# Patient Record
Sex: Male | Born: 1950
Health system: Southern US, Community
[De-identification: ages and names within clinical notes are randomized; demographics above are authoritative.]

## PROBLEM LIST (undated history)

## (undated) DIAGNOSIS — J449 Chronic obstructive pulmonary disease, unspecified: Secondary | ICD-10-CM

## (undated) DIAGNOSIS — Z91199 Patient's noncompliance with other medical treatment and regimen due to unspecified reason: Secondary | ICD-10-CM

## (undated) DIAGNOSIS — F1490 Cocaine use, unspecified, uncomplicated: Secondary | ICD-10-CM

## (undated) DIAGNOSIS — I10 Essential (primary) hypertension: Secondary | ICD-10-CM

## (undated) DIAGNOSIS — F17200 Nicotine dependence, unspecified, uncomplicated: Secondary | ICD-10-CM

## (undated) DIAGNOSIS — I2510 Atherosclerotic heart disease of native coronary artery without angina pectoris: Secondary | ICD-10-CM

## (undated) DIAGNOSIS — N182 Chronic kidney disease, stage 2 (mild): Secondary | ICD-10-CM

## (undated) DIAGNOSIS — Z9119 Patient's noncompliance with other medical treatment and regimen: Secondary | ICD-10-CM

## (undated) DIAGNOSIS — T148XXA Other injury of unspecified body region, initial encounter: Secondary | ICD-10-CM

## (undated) DIAGNOSIS — B1920 Unspecified viral hepatitis C without hepatic coma: Secondary | ICD-10-CM

## (undated) DIAGNOSIS — I428 Other cardiomyopathies: Secondary | ICD-10-CM

## (undated) DIAGNOSIS — I5022 Chronic systolic (congestive) heart failure: Secondary | ICD-10-CM

## (undated) HISTORY — DX: Patient's noncompliance with other medical treatment and regimen: Z91.19

## (undated) HISTORY — DX: Patient's noncompliance with other medical treatment and regimen due to unspecified reason: Z91.199

## (undated) HISTORY — PX: TONSILLECTOMY: SUR1361

## (undated) HISTORY — DX: Chronic systolic (congestive) heart failure: I50.22

## (undated) HISTORY — DX: Chronic kidney disease, stage 2 (mild): N18.2

## (undated) HISTORY — DX: Unspecified viral hepatitis C without hepatic coma: B19.20

## (undated) HISTORY — DX: Cocaine use, unspecified, uncomplicated: F14.90

## (undated) HISTORY — PX: FRACTURE SURGERY: SHX138

---

## 1974-09-23 DIAGNOSIS — T148XXA Other injury of unspecified body region, initial encounter: Secondary | ICD-10-CM

## 1974-09-23 HISTORY — DX: Other injury of unspecified body region, initial encounter: T14.8XXA

## 2008-05-02 ENCOUNTER — Emergency Department (HOSPITAL_COMMUNITY): Admission: EM | Admit: 2008-05-02 | Discharge: 2008-05-02 | Payer: Self-pay | Admitting: Emergency Medicine

## 2014-07-26 ENCOUNTER — Encounter (HOSPITAL_COMMUNITY): Payer: Self-pay | Admitting: *Deleted

## 2014-07-26 ENCOUNTER — Inpatient Hospital Stay (HOSPITAL_COMMUNITY)
Admission: EM | Admit: 2014-07-26 | Discharge: 2014-07-29 | DRG: 280 | Disposition: A | Discharge status: Home / Self Care | Payer: Medicare Other | Attending: Cardiology | Admitting: Cardiology

## 2014-07-26 ENCOUNTER — Emergency Department (HOSPITAL_COMMUNITY): Payer: Medicare Other

## 2014-07-26 DIAGNOSIS — F17210 Nicotine dependence, cigarettes, uncomplicated: Secondary | ICD-10-CM | POA: Diagnosis present

## 2014-07-26 DIAGNOSIS — I447 Left bundle-branch block, unspecified: Secondary | ICD-10-CM | POA: Diagnosis present

## 2014-07-26 DIAGNOSIS — I2584 Coronary atherosclerosis due to calcified coronary lesion: Secondary | ICD-10-CM | POA: Diagnosis present

## 2014-07-26 DIAGNOSIS — I129 Hypertensive chronic kidney disease with stage 1 through stage 4 chronic kidney disease, or unspecified chronic kidney disease: Secondary | ICD-10-CM | POA: Diagnosis present

## 2014-07-26 DIAGNOSIS — R740 Nonspecific elevation of levels of transaminase and lactic acid dehydrogenase [LDH]: Secondary | ICD-10-CM

## 2014-07-26 DIAGNOSIS — J80 Acute respiratory distress syndrome: Secondary | ICD-10-CM | POA: Diagnosis present

## 2014-07-26 DIAGNOSIS — F1410 Cocaine abuse, uncomplicated: Secondary | ICD-10-CM | POA: Diagnosis present

## 2014-07-26 DIAGNOSIS — I420 Dilated cardiomyopathy: Secondary | ICD-10-CM | POA: Diagnosis present

## 2014-07-26 DIAGNOSIS — R0600 Dyspnea, unspecified: Secondary | ICD-10-CM | POA: Diagnosis present

## 2014-07-26 DIAGNOSIS — E874 Mixed disorder of acid-base balance: Secondary | ICD-10-CM | POA: Diagnosis present

## 2014-07-26 DIAGNOSIS — D45 Polycythemia vera: Secondary | ICD-10-CM | POA: Diagnosis present

## 2014-07-26 DIAGNOSIS — R0902 Hypoxemia: Secondary | ICD-10-CM

## 2014-07-26 DIAGNOSIS — J9601 Acute respiratory failure with hypoxia: Secondary | ICD-10-CM | POA: Diagnosis present

## 2014-07-26 DIAGNOSIS — J810 Acute pulmonary edema: Secondary | ICD-10-CM | POA: Diagnosis not present

## 2014-07-26 DIAGNOSIS — R069 Unspecified abnormalities of breathing: Secondary | ICD-10-CM | POA: Diagnosis not present

## 2014-07-26 DIAGNOSIS — J449 Chronic obstructive pulmonary disease, unspecified: Secondary | ICD-10-CM | POA: Diagnosis present

## 2014-07-26 DIAGNOSIS — R739 Hyperglycemia, unspecified: Secondary | ICD-10-CM | POA: Diagnosis present

## 2014-07-26 DIAGNOSIS — E1165 Type 2 diabetes mellitus with hyperglycemia: Secondary | ICD-10-CM | POA: Diagnosis present

## 2014-07-26 DIAGNOSIS — I5021 Acute systolic (congestive) heart failure: Secondary | ICD-10-CM | POA: Diagnosis present

## 2014-07-26 DIAGNOSIS — D72820 Lymphocytosis (symptomatic): Secondary | ICD-10-CM | POA: Diagnosis not present

## 2014-07-26 DIAGNOSIS — J9600 Acute respiratory failure, unspecified whether with hypoxia or hypercapnia: Secondary | ICD-10-CM | POA: Diagnosis present

## 2014-07-26 DIAGNOSIS — F17200 Nicotine dependence, unspecified, uncomplicated: Secondary | ICD-10-CM

## 2014-07-26 DIAGNOSIS — I25110 Atherosclerotic heart disease of native coronary artery with unstable angina pectoris: Secondary | ICD-10-CM | POA: Diagnosis not present

## 2014-07-26 DIAGNOSIS — I2510 Atherosclerotic heart disease of native coronary artery without angina pectoris: Secondary | ICD-10-CM | POA: Diagnosis present

## 2014-07-26 DIAGNOSIS — N179 Acute kidney failure, unspecified: Secondary | ICD-10-CM | POA: Diagnosis not present

## 2014-07-26 DIAGNOSIS — F1910 Other psychoactive substance abuse, uncomplicated: Secondary | ICD-10-CM

## 2014-07-26 DIAGNOSIS — I517 Cardiomegaly: Secondary | ICD-10-CM

## 2014-07-26 DIAGNOSIS — E876 Hypokalemia: Secondary | ICD-10-CM | POA: Diagnosis not present

## 2014-07-26 DIAGNOSIS — I429 Cardiomyopathy, unspecified: Secondary | ICD-10-CM | POA: Diagnosis not present

## 2014-07-26 DIAGNOSIS — J984 Other disorders of lung: Secondary | ICD-10-CM | POA: Diagnosis not present

## 2014-07-26 DIAGNOSIS — R7401 Elevation of levels of liver transaminase levels: Secondary | ICD-10-CM | POA: Diagnosis present

## 2014-07-26 DIAGNOSIS — I428 Other cardiomyopathies: Secondary | ICD-10-CM

## 2014-07-26 DIAGNOSIS — N189 Chronic kidney disease, unspecified: Secondary | ICD-10-CM | POA: Diagnosis not present

## 2014-07-26 DIAGNOSIS — N182 Chronic kidney disease, stage 2 (mild): Secondary | ICD-10-CM | POA: Diagnosis present

## 2014-07-26 DIAGNOSIS — I10 Essential (primary) hypertension: Secondary | ICD-10-CM | POA: Diagnosis present

## 2014-07-26 DIAGNOSIS — I214 Non-ST elevation (NSTEMI) myocardial infarction: Secondary | ICD-10-CM

## 2014-07-26 DIAGNOSIS — Z720 Tobacco use: Secondary | ICD-10-CM

## 2014-07-26 HISTORY — DX: Other cardiomyopathies: I42.8

## 2014-07-26 HISTORY — DX: Nicotine dependence, unspecified, uncomplicated: F17.200

## 2014-07-26 HISTORY — DX: Atherosclerotic heart disease of native coronary artery without angina pectoris: I25.10

## 2014-07-26 HISTORY — DX: Other injury of unspecified body region, initial encounter: T14.8XXA

## 2014-07-26 HISTORY — DX: Essential (primary) hypertension: I10

## 2014-07-26 HISTORY — DX: Chronic obstructive pulmonary disease, unspecified: J44.9

## 2014-07-26 LAB — LIPID PANEL
CHOL/HDL RATIO: 3 ratio
Cholesterol: 127 mg/dL (ref 0–200)
HDL: 43 mg/dL (ref >39)
LDL Cholesterol: 74 mg/dL (ref 0–99)
Triglycerides: 50 mg/dL (ref <150)
VLDL: 10 mg/dL (ref 0–40)

## 2014-07-26 LAB — TROPONIN I
TROPONIN I: 1.71 ng/mL — AB (ref <0.30)
TROPONIN I: 1.59 ng/mL — AB (ref <0.30)
Troponin I: 1.32 ng/mL (ref <0.30)

## 2014-07-26 LAB — URINALYSIS, ROUTINE W REFLEX MICROSCOPIC
Bilirubin Urine: NEGATIVE
Glucose, UA: NEGATIVE mg/dL
HGB URINE DIPSTICK: NEGATIVE
Ketones, ur: NEGATIVE mg/dL
LEUKOCYTES UA: NEGATIVE
Nitrite: NEGATIVE
PROTEIN: 30 mg/dL — AB
SPECIFIC GRAVITY, URINE: 1.011 (ref 1.005–1.030)
UROBILINOGEN UA: 0.2 mg/dL (ref 0.0–1.0)
pH: 5 (ref 5.0–8.0)

## 2014-07-26 LAB — I-STAT TROPONIN, ED
TROPONIN I, POC: 0.04 ng/mL (ref 0.00–0.08)
TROPONIN I, POC: 0.43 ng/mL — AB (ref 0.00–0.08)

## 2014-07-26 LAB — CBC WITH DIFFERENTIAL/PLATELET
BASOS ABS: 0 10*3/uL (ref 0.0–0.1)
BASOS PCT: 0 % (ref 0–1)
EOS ABS: 0.1 10*3/uL (ref 0.0–0.7)
Eosinophils Relative: 1 % (ref 0–5)
HEMATOCRIT: 50.4 % (ref 39.0–52.0)
Hemoglobin: 17.1 g/dL — ABNORMAL HIGH (ref 13.0–17.0)
LYMPHS PCT: 55 % — AB (ref 12–46)
Lymphs Abs: 7.3 10*3/uL — ABNORMAL HIGH (ref 0.7–4.0)
MCH: 31.1 pg (ref 26.0–34.0)
MCHC: 33.9 g/dL (ref 30.0–36.0)
MCV: 91.6 fL (ref 78.0–100.0)
MONO ABS: 0.8 10*3/uL (ref 0.1–1.0)
Monocytes Relative: 6 % (ref 3–12)
NEUTROS ABS: 5.1 10*3/uL (ref 1.7–7.7)
NEUTROS PCT: 38 % — AB (ref 43–77)
Platelets: 156 10*3/uL (ref 150–400)
RBC: 5.5 MIL/uL (ref 4.22–5.81)
RDW: 13.3 % (ref 11.5–15.5)
WBC: 13.3 10*3/uL — ABNORMAL HIGH (ref 4.0–10.5)

## 2014-07-26 LAB — I-STAT ARTERIAL BLOOD GAS, ED
Acid-base deficit: 12 mmol/L — ABNORMAL HIGH (ref 0.0–2.0)
Bicarbonate: 17 mEq/L — ABNORMAL LOW (ref 20.0–24.0)
O2 SAT: 98 %
PCO2 ART: 49.2 mmHg — AB (ref 35.0–45.0)
PH ART: 7.146 — AB (ref 7.350–7.450)
PO2 ART: 139 mmHg — AB (ref 80.0–100.0)
Patient temperature: 98.6
TCO2: 18 mmol/L (ref 0–100)

## 2014-07-26 LAB — COMPREHENSIVE METABOLIC PANEL
ALT: 66 U/L — ABNORMAL HIGH (ref 0–53)
AST: 55 U/L — ABNORMAL HIGH (ref 0–37)
Albumin: 3.7 g/dL (ref 3.5–5.2)
Alkaline Phosphatase: 97 U/L (ref 39–117)
Anion gap: 24 — ABNORMAL HIGH (ref 5–15)
BILIRUBIN TOTAL: 0.5 mg/dL (ref 0.3–1.2)
BUN: 10 mg/dL (ref 6–23)
CALCIUM: 9.3 mg/dL (ref 8.4–10.5)
CHLORIDE: 98 meq/L (ref 96–112)
CO2: 16 meq/L — AB (ref 19–32)
CREATININE: 1.46 mg/dL — AB (ref 0.50–1.35)
GFR, EST AFRICAN AMERICAN: 56 mL/min — AB (ref >90)
GFR, EST NON AFRICAN AMERICAN: 49 mL/min — AB (ref >90)
GLUCOSE: 238 mg/dL — AB (ref 70–99)
Potassium: 4 mEq/L (ref 3.7–5.3)
Sodium: 138 mEq/L (ref 137–147)
Total Protein: 8.6 g/dL — ABNORMAL HIGH (ref 6.0–8.3)

## 2014-07-26 LAB — I-STAT CHEM 8, ED
BUN: 8 mg/dL (ref 6–23)
CALCIUM ION: 1.16 mmol/L (ref 1.13–1.30)
Chloride: 103 mEq/L (ref 96–112)
Creatinine, Ser: 1.6 mg/dL — ABNORMAL HIGH (ref 0.50–1.35)
Glucose, Bld: 229 mg/dL — ABNORMAL HIGH (ref 70–99)
HEMATOCRIT: 58 % — AB (ref 39.0–52.0)
HEMOGLOBIN: 19.7 g/dL — AB (ref 13.0–17.0)
Potassium: 3.4 mEq/L — ABNORMAL LOW (ref 3.7–5.3)
SODIUM: 140 meq/L (ref 137–147)
TCO2: 18 mmol/L (ref 0–100)

## 2014-07-26 LAB — GLUCOSE, CAPILLARY
GLUCOSE-CAPILLARY: 132 mg/dL — AB (ref 70–99)
Glucose-Capillary: 120 mg/dL — ABNORMAL HIGH (ref 70–99)
Glucose-Capillary: 105 mg/dL — ABNORMAL HIGH (ref 70–99)
Glucose-Capillary: 108 mg/dL — ABNORMAL HIGH (ref 70–99)

## 2014-07-26 LAB — RAPID URINE DRUG SCREEN, HOSP PERFORMED
AMPHETAMINES: NOT DETECTED
Barbiturates: NOT DETECTED
Benzodiazepines: NOT DETECTED
Cocaine: POSITIVE — AB
OPIATES: NOT DETECTED
Tetrahydrocannabinol: NOT DETECTED

## 2014-07-26 LAB — URINE MICROSCOPIC-ADD ON

## 2014-07-26 LAB — TYPE AND SCREEN
ABO/RH(D): A POS
Antibody Screen: NEGATIVE

## 2014-07-26 LAB — PATHOLOGIST SMEAR REVIEW: Path Review: REACTIVE

## 2014-07-26 LAB — HEPARIN LEVEL (UNFRACTIONATED)
Heparin Unfractionated: 0.31 IU/mL (ref 0.30–0.70)
Heparin Unfractionated: 0.14 IU/mL — ABNORMAL LOW (ref 0.30–0.70)

## 2014-07-26 LAB — HEMOGLOBIN A1C
Hgb A1c MFr Bld: 5.6 % (ref <5.7)
Mean Plasma Glucose: 114 mg/dL (ref <117)

## 2014-07-26 LAB — PROTIME-INR
INR: 1.05 (ref 0.00–1.49)
Prothrombin Time: 13.8 seconds (ref 11.6–15.2)

## 2014-07-26 LAB — I-STAT CG4 LACTIC ACID, ED: LACTIC ACID, VENOUS: 8.61 mmol/L — AB (ref 0.5–2.2)

## 2014-07-26 LAB — ABO/RH: ABO/RH(D): A POS

## 2014-07-26 LAB — MRSA PCR SCREENING: MRSA by PCR: NEGATIVE

## 2014-07-26 LAB — PRO B NATRIURETIC PEPTIDE: PRO B NATRI PEPTIDE: 6836 pg/mL — AB (ref 0–125)

## 2014-07-26 MED ORDER — PNEUMOCOCCAL VAC POLYVALENT 25 MCG/0.5ML IJ INJ
0.5000 mL | INJECTION | INTRAMUSCULAR | Status: AC
  Administered 2014-07-28: 0.5 mL via INTRAMUSCULAR
  Filled 2014-07-26: qty 0.5

## 2014-07-26 MED ORDER — LORAZEPAM 2 MG/ML IJ SOLN
INTRAMUSCULAR | Status: AC
  Filled 2014-07-26: qty 1

## 2014-07-26 MED ORDER — ASPIRIN EC 81 MG PO TBEC
81.0000 mg | DELAYED_RELEASE_TABLET | Freq: Every day | ORAL | Status: DC
  Filled 2014-07-26: qty 1

## 2014-07-26 MED ORDER — SODIUM CHLORIDE 0.9 % IJ SOLN
3.0000 mL | Freq: Two times a day (BID) | INTRAMUSCULAR | Status: DC
  Administered 2014-07-26 (×2): 3 mL via INTRAVENOUS

## 2014-07-26 MED ORDER — SODIUM CHLORIDE 0.9 % IV SOLN: 250.0000 mL | INTRAVENOUS | Status: DC | PRN

## 2014-07-26 MED ORDER — ASPIRIN 81 MG PO CHEW: 324.0000 mg | CHEWABLE_TABLET | Freq: Once | ORAL | Status: DC

## 2014-07-26 MED ORDER — SIMVASTATIN 20 MG PO TABS
20.0000 mg | ORAL_TABLET | Freq: Every day | ORAL | Status: DC
  Administered 2014-07-26 – 2014-07-29 (×4): 20 mg via ORAL
  Filled 2014-07-26 (×4): qty 1

## 2014-07-26 MED ORDER — SODIUM CHLORIDE 0.9 % IJ SOLN: 3.0000 mL | INTRAMUSCULAR | Status: DC | PRN

## 2014-07-26 MED ORDER — AMLODIPINE BESYLATE 5 MG PO TABS
5.0000 mg | ORAL_TABLET | Freq: Every day | ORAL | Status: DC
  Administered 2014-07-26 – 2014-07-29 (×4): 5 mg via ORAL
  Filled 2014-07-26 (×5): qty 1

## 2014-07-26 MED ORDER — ASPIRIN 81 MG PO CHEW
324.0000 mg | CHEWABLE_TABLET | Freq: Once | ORAL | Status: AC
  Administered 2014-07-26: 324 mg via ORAL
  Filled 2014-07-26: qty 4

## 2014-07-26 MED ORDER — ASPIRIN 300 MG RE SUPP: 300.0000 mg | Freq: Once | RECTAL | Status: DC

## 2014-07-26 MED ORDER — ACETAMINOPHEN 325 MG PO TABS
650.0000 mg | ORAL_TABLET | ORAL | Status: DC | PRN
  Administered 2014-07-26: 650 mg via ORAL
  Filled 2014-07-26: qty 2

## 2014-07-26 MED ORDER — HEPARIN BOLUS VIA INFUSION
2000.0000 [IU] | Freq: Once | INTRAVENOUS | Status: AC
  Administered 2014-07-26: 2000 [IU] via INTRAVENOUS
  Filled 2014-07-26: qty 2000

## 2014-07-26 MED ORDER — POTASSIUM CHLORIDE CRYS ER 20 MEQ PO TBCR
40.0000 meq | EXTENDED_RELEASE_TABLET | Freq: Once | ORAL | Status: AC
  Administered 2014-07-26: 40 meq via ORAL
  Filled 2014-07-26: qty 2

## 2014-07-26 MED ORDER — FUROSEMIDE 10 MG/ML IJ SOLN
40.0000 mg | Freq: Once | INTRAMUSCULAR | Status: AC
  Administered 2014-07-26: 40 mg via INTRAVENOUS

## 2014-07-26 MED ORDER — SIMVASTATIN 40 MG PO TABS
40.0000 mg | ORAL_TABLET | Freq: Every day | ORAL | Status: DC
  Filled 2014-07-26: qty 1

## 2014-07-26 MED ORDER — INSULIN ASPART 100 UNIT/ML ~~LOC~~ SOLN
0.0000 [IU] | Freq: Three times a day (TID) | SUBCUTANEOUS | Status: DC
  Administered 2014-07-26: 2 [IU] via SUBCUTANEOUS

## 2014-07-26 MED ORDER — INFLUENZA VAC SPLIT QUAD 0.5 ML IM SUSY
0.5000 mL | PREFILLED_SYRINGE | INTRAMUSCULAR | Status: AC
  Administered 2014-07-28: 0.5 mL via INTRAMUSCULAR
  Filled 2014-07-26: qty 0.5

## 2014-07-26 MED ORDER — HEPARIN (PORCINE) IN NACL 100-0.45 UNIT/ML-% IJ SOLN
1250.0000 [IU]/h | INTRAMUSCULAR | Status: DC
  Administered 2014-07-26: 1000 [IU]/h via INTRAVENOUS
  Administered 2014-07-26: 1250 [IU]/h via INTRAVENOUS
  Filled 2014-07-26 (×4): qty 250

## 2014-07-26 MED ORDER — ASPIRIN 81 MG PO CHEW: 324.0000 mg | CHEWABLE_TABLET | ORAL | Status: DC

## 2014-07-26 MED ORDER — ASPIRIN 300 MG RE SUPP: 300.0000 mg | RECTAL | Status: DC

## 2014-07-26 MED ORDER — FUROSEMIDE 10 MG/ML IJ SOLN
20.0000 mg | Freq: Once | INTRAMUSCULAR | Status: DC
  Filled 2014-07-26: qty 2

## 2014-07-26 MED ORDER — HEPARIN BOLUS VIA INFUSION
4000.0000 [IU] | Freq: Once | INTRAVENOUS | Status: AC
  Administered 2014-07-26 (×2): 4000 [IU] via INTRAVENOUS
  Filled 2014-07-26: qty 4000

## 2014-07-26 MED ORDER — NITROGLYCERIN IN D5W 200-5 MCG/ML-% IV SOLN
10.0000 ug/min | INTRAVENOUS | Status: DC
  Administered 2014-07-26: 50 ug/min via INTRAVENOUS
  Filled 2014-07-26: qty 250

## 2014-07-26 MED ORDER — FUROSEMIDE 10 MG/ML IJ SOLN
40.0000 mg | Freq: Every day | INTRAMUSCULAR | Status: DC
  Administered 2014-07-26 – 2014-07-27 (×2): 40 mg via INTRAVENOUS
  Filled 2014-07-26 (×3): qty 4

## 2014-07-26 NOTE — Progress Notes (Signed)
Patient denies any CP/SOB, states he feels "good", we have begun titrating the nitro, will continue to monitor BP and VS. Patient to let RN know if there is any occurrence of CP or SOB. MD updated.   Roxan Hockey, RN

## 2014-07-26 NOTE — ED Notes (Signed)
Pt rates chest pain #3 on pain scale 0/10

## 2014-07-26 NOTE — Plan of Care (Signed)
Problem: Consults Goal: Chest Pain Patient Education (See Patient Education module for education specifics.) Outcome: Progressing Goal: Skin Care Protocol Initiated - if Braden Score 18 or less If consults are not indicated, leave blank or document N/A Outcome: Progressing Goal: Tobacco Cessation referral if indicated Outcome: Progressing Goal: Nutrition Consult-if indicated Outcome: Progressing  Problem: Phase I Progression Outcomes Goal: Hemodynamically stable Outcome: Progressing Goal: Anginal pain relieved Outcome: Progressing Goal: Aspirin unless contraindicated Outcome: Completed/Met Date Met:  07/26/14 Goal: MD aware of Cardiac Marker results Outcome: Completed/Met Date Met:  07/26/14 Goal: Voiding-avoid urinary catheter unless indicated Outcome: Completed/Met Date Met:  07/26/14

## 2014-07-26 NOTE — H&P (Signed)
CARDIOLOGY CONSULT NOTE  Patient ID: Jeffery Conrad, MRN: [id number], DOB/AGE: 01/05/1951 63 y.o. Admit date: 07/26/2014 Date of Consult: 07/26/2014  Primary Physician: No primary care provider on file. Primary Cardiologist: unassigned  Chief Complaint: chest pain, SOB Reason for Consultation: cardiac cause  HPI: 63 y.o. male who reports no significant past medical history who presented to [facility] on 07/26/2014 with complaints of chest pain and shortness of breath.  Patient endorses use of crack cocaine, smoked, in the last 24 hours. Also with tobacco abuse. Unaware of any other medical problems but has not been evaluated by healthcare. Reports that approx 12 hours ago, had increasing shortness of breath. In the last 4-5 hours, he became acutely more short of breath and then began to have chest pain. Center of his chest. Squeezing sensation.  Upon arrival by EMS, was found to be hypoxic requiring bipap. Pressures also in the 190s and a nitro gtt was started.   EKG showed sinus tachy with LBBB and freq PVCs. Chronicity of LBBB unknown, no comparison available. Cxray demonstrate diffuse edema vs. Consolidation. Initial troponin negative. BNP of 7000. WBC of 13. Respiratory and metabolic acidosis: 3.15/17/616 with lactate of 8.7.  Bedside echo performed by myself. Very difficult due to dyspnea but brief apical 4 and 2 chamber demonstrate significant global hypokinesis, EF 20%.  Currently the patient is chest pain free and breathing much more comfortably, still on CPAP.  PMH: 1. Tobacco abuse 2. Admits to crack cocaine use  No past medical history on file.    Surgical History: No past surgical history on file.   Home Meds: Prior to Admission medications   Not on File  none  Inpatient Medications:  . aspirin  300 mg Rectal Once   . nitroGLYCERIN 60 mcg/min (07/26/14 0234)    Allergies: No Known Allergies  SH: Denies ETOH. Smokes 1 ppd Endorse smoked crack  cocaine use  FH: Noncontributary.   No family history on file.   Review of Systems: General: negative for chills, fever, night sweats or weight changes.  Cardiovascular: see HPI. Dermatological: negative for rash Respiratory: see HPI. Urologic: negative for hematuria Abdominal: negative for nausea, vomiting, diarrhea, bright red blood per rectum, melena, or hematemesis Neurologic: negative for visual changes, syncope, or dizziness All other systems reviewed and are otherwise negative except as noted above.  Labs:  Recent Labs  07/26/14 0151  TROPONINI <0.30   Lab Results  Component Value Date   WBC 13.3* 07/26/2014   HGB 19.7* 07/26/2014   HCT 58.0* 07/26/2014   MCV 91.6 07/26/2014   PLT 156 07/26/2014    Recent Labs Lab 07/26/14 0151 07/26/14 0200  NA 138 140  K 4.0 3.4*  CL 98 103  CO2 16*  --   BUN 10 8  CREATININE 1.46* 1.60*  CALCIUM 9.3  --   PROT 8.6*  --   BILITOT 0.5  --   ALKPHOS 97  --   ALT 66*  --   AST 55*  --   GLUCOSE 238* 229*   No results found for: CHOL, HDL, LDLCALC, TRIG No results found for: DDIMER  Radiology/Studies:  Dg Chest Portable 1 View  07/26/2014   CLINICAL DATA:  Hypoxia.  EXAM: PORTABLE CHEST - 1 VIEW  COMPARISON:  None.  FINDINGS: Heart size is normal. Pulmonary vascularity appears normal. Diffuse interstitial and airspace disease in the lungs suggesting edema versus diffuse pneumonia. No blunting of visualize costophrenic angles. No pneumothorax.  IMPRESSION: Diffuse interstitial and  airspace disease in the lungs suggesting edema versus pneumonia.   Electronically Signed   By: Lucienne Capers M.D.   On: 07/26/2014 02:11    EKG: sinus tach, LBBB, no comparison  Physical Exam: Blood pressure 135/98, pulse 86, resp. rate 36, height 6' (1.829 m), weight 83.915 kg (185 lb), SpO2 98 %. General: Well developed, well nourished, though disheveled. Tachypneic on Bipap Head: Normocephalic, atraumatic, sclera non-icteric, no  xanthomas, nares are without discharge. Poor dentitiion  Neck: Supple. JVD not elevated. Lungs: diffuse rhonchi Heart: tachycardiac, no sign murmurs Abdomen: Soft, non-tender, non-distended with normoactive bowel sounds. No hepatomegaly. No rebound/guarding. No obvious abdominal masses. Msk:  Strength and tone appear normal for age. Extremities: No clubbing or cyanosis. No edema.  Distal pedal pulses are 2+ and equal bilaterally. Neuro: Alert and oriented X 3. Moves all extremities spontaneously. Psych:  Responds to questions appropriately   Assessment and Plan:  Problem List 1. Acute respiratory distress with hypoxia and hypercarbia 2. Acute systolic heart failure by echo 3. LBBB, unknown chronicity 4. Renal failure, presumably acute 5. Chest pain, elevated troponin --> acute coronary syndrome 6.  Elevated glucose, likely diabetic 7.  Elevated LFTs 8. Respiratory and metabolic acidosis 9. Crack cocaine use 10. Tobacco abuse 11. Polycythemia 12. Hypertensive emergency   63 y.o. male with risk factors of tobacco abuse and crack cocaine abuse who presented to [facility] on 07/26/2014 with complaints of chest pain and shortness of breath --> found to be in pulmonary edema with bedside EF of 20%.  Though no objective data for comparison, the patient's history and presentation suggest that there is some chronicity to his cardiac issues though likely undiagnosed as he has minimal contact with healthcare. Initial decision-making regarding the urgency to go the cath lab due to LBBB (?new onset): with stabilization of his pulmonary issues with CPAP and improvement in his pressures with nitro, the patient reports being chest pain free which reduces the urgency to go the lab. Though his delta troponin is increased, it is not of the magnitude one would expect if his LAD was occluded. Furthermore, his echo with diffuse hypokinesis also suggests some chronicity to his cardiac issues rather than  an acute occlusion.  Will continue aspirin, nitro gtt, start a statin. Heparinize.  Avoid beta blockers due to crack cocaine use. Anticipate due to cardiac catheterization in the AM if his respiratory status continues to improve. Keep NPO. Echo in the AM.  His pulmonary picture appears to be consistent with flash pulmonary edema, presumably acute on chronic, precipitated by recent crack cocaine use and acute ischemia. Respiratory status improving on bipap. Nitro for preload reduction and lasix for diuresis. Contribution of "crack lung" also possible. Elevated WBC but no eosinophilic shift. No other evidence of infection (fever, sputum, etc.). Hold on ABx for now.   Full echo to followup bedside echo that suggested Ef 20%. No beta blockers due to acute decompensation and cocaine use. Add ACEI once renal function improves. Diuresing with IV lasix.  Renal dysfunction presumed to be acute, possibly cardio-renal from poor perfusion or from hypertensive emergency. Check U/A. Reassess after diuresis. Renal ultrasound if no improvement.  Elevated CBG, check HgA1c. Cover with sliding scale.  Will need counseling regarding tobacco and crack use. Anticipate psycho-social issues regarding financial barriers to care.  Full code NPO PPI Heparin gtt.   Signed, Kindel Rochefort C. MD 07/26/2014, 3:12 AM

## 2014-07-26 NOTE — Progress Notes (Addendum)
Patient seen and examined. BP better.  Breathing better after lasix.    1) Wean NTG.  Add amlodipine if BP increases.  Holding ACE for now until we are certain renal function is stable.  If low EF by echo, then plan ACE-I  And beta blocker (nonselective) when CHF resolved. May need cath in the future.  Not today- will start diet.   Addendum: Increased troponin.  If EF low with stable renal function, will make NPO for cath tomorrow.  Retha Bither S.

## 2014-07-26 NOTE — ED Notes (Signed)
NTG gtt increased to 26mcg/min per VO Dr. Regenia Skeeter

## 2014-07-26 NOTE — ED Notes (Signed)
Patient is resting comfortably. 

## 2014-07-26 NOTE — Plan of Care (Signed)
Problem: Phase I Progression Outcomes Goal: Anginal pain relieved Outcome: Completed/Met Date Met:  07/26/14 Goal: Other Phase I Outcomes/Goals Outcome: Not Applicable Date Met:  30/09/79  Problem: Phase II Progression Outcomes Goal: Hemodynamically stable Outcome: Completed/Met Date Met:  07/26/14 Goal: Anginal pain relieved Outcome: Completed/Met Date Met:  07/26/14 Goal: CV Risk Factors identified Outcome: Completed/Met Date Met:  07/26/14

## 2014-07-26 NOTE — Progress Notes (Signed)
ANTICOAGULATION CONSULT NOTE - Follow Up Consult  Pharmacy Consult for Heparin Indication: chest pain/ACS  No Known Allergies  Patient Measurements: Height: 6' (182.9 cm) Weight: 185 lb (83.915 kg) IBW/kg (Calculated) : 77.6  Vital Signs: BP: 135/98 mmHg (11/03 0230) Pulse Rate: 46 (11/03 0215)  Labs:  Recent Labs  07/26/14 0151 07/26/14 0200  HGB 17.1* 19.7*  HCT 50.4 58.0*  PLT 156  --   LABPROT 13.8  --   INR 1.05  --   CREATININE 1.46* 1.60*  TROPONINI <0.30  --     Estimated Creatinine Clearance: 50.5 mL/min (by C-G formula based on Cr of 1.6).   Medications:  None  Assessment: 63 y.o. male with chest pain for heparin    Goal of Therapy:  Heparin level 0.3-0.7 units/ml Monitor platelets by anticoagulation protocol: Yes   Plan:  Heparin 4000 units IV bolus, then start heparin 1000 units/hr Check heparin level in 6 hours.   Caryl Pina 07/26/2014,3:43 AM

## 2014-07-26 NOTE — ED Notes (Signed)
Pt continues on Bi-pap, st's he feels like he is breathing better at this time.  Skin remains cool and dry.  Pt remains alert and oriented x's 3.  Pt denies any chest pain at this time.

## 2014-07-26 NOTE — Progress Notes (Signed)
CRITICAL VALUE ALERT  Critical value received:  Tropinin 1.71  Date of notification: 07/26/14  Time of notification:  0017  Critical value read back:Yes.    Nurse who received alert:  Elmarie Shiley  MD notified (1st page):  Heber Bliss, MD

## 2014-07-26 NOTE — Progress Notes (Signed)
Pt stated he did not want to wear BiPAP at this time, Rt will continue to monitor.

## 2014-07-26 NOTE — Plan of Care (Signed)
Problem: Consults Goal: Chest Pain Patient Education (See Patient Education module for education specifics.)  Outcome: Completed/Met Date Met:  07/26/14 Goal: Skin Care Protocol Initiated - if Braden Score 18 or less If consults are not indicated, leave blank or document N/A  Outcome: Not Applicable Date Met:  02/54/27 Goal: Tobacco Cessation referral if indicated Outcome: Completed/Met Date Met:  07/26/14 Goal: Nutrition Consult-if indicated Outcome: Not Applicable Date Met:  03/16/75 Goal: Diabetes Guidelines if Diabetic/Glucose > 140 If diabetic or lab glucose is > 140 mg/dl - Initiate Diabetes/Hyperglycemia Guidelines & Document Interventions  Outcome: Not Applicable Date Met:  28/31/51  Problem: Phase I Progression Outcomes Goal: Hemodynamically stable Outcome: Completed/Met Date Met:  07/26/14

## 2014-07-26 NOTE — ED Notes (Signed)
Pt tolerating BiPap at this time.

## 2014-07-26 NOTE — ED Notes (Signed)
Pt to ED via [facility] with c/o shortness of breath.  Pt st's he has felt shortness of breath all day but became worse tonight.  On arrival to ED pt on NRB mask with 02 sat of 85%.  EMS st's they tried to place C-Pap on pt but pt would not tolerate.  EMS st's pt did c/o of substernal chest pain

## 2014-07-26 NOTE — Progress Notes (Signed)
ANTICOAGULATION CONSULT NOTE - Follow Up Consult  Pharmacy Consult for Heparin Indication: chest pain/ACS  No Known Allergies  Patient Measurements: Height: 6' (182.9 cm) Weight: 176 lb 2.4 oz (79.9 kg) IBW/kg (Calculated) : 77.6  Vital Signs: Temp: 97.6 F (36.4 C) (11/03 0800) Temp Source: Oral (11/03 0800) BP: 105/74 mmHg (11/03 1030) Pulse Rate: 64 (11/03 1030)  Labs:  Recent Labs  07/26/14 0151 07/26/14 0200 07/26/14 0842 07/26/14 1022  HGB 17.1* 19.7*  --   --   HCT 50.4 58.0*  --   --   PLT 156  --   --   --   LABPROT 13.8  --   --   --   INR 1.05  --   --   --   HEPARINUNFRC  --   --   --  0.31  CREATININE 1.46* 1.60*  --   --   TROPONINI <0.30  --  1.71*  --     Estimated Creatinine Clearance: 50.5 mL/min (by C-G formula based on Cr of 1.6).  Assessment: 63 yo M presents on 11/3 with chest pain / ACS. Pharmacy to dose heparin. HL is 0.31. Hgb elevated at 19.7, Plts 156. No s/s of bleed. One therapeutic HL so will plan to verify therapeutic level in 6 hours.  Goal of Therapy:  Heparin level 0.3-0.7 units/ml Monitor platelets by anticoagulation protocol: Yes   Plan:  Continue heparin drip at 1,000units/hr Recheck HL at 1700 Monitor daily HL, CBC, s/s of bleed  Malosi Hemstreet J 07/26/2014,11:24 AM

## 2014-07-26 NOTE — Progress Notes (Signed)
07/26/2014 1000 Chart reviewed. UR completed. Jonnie Finner RN CCM Case Mgmt phone [phone]

## 2014-07-26 NOTE — ED Provider Notes (Signed)
CSN: [id number]     Arrival date & time 07/26/14  0151 History   First MD Initiated Contact with Patient 07/26/14 0155     Chief Complaint  Patient presents with  . Respiratory Distress     (Consider location/radiation/quality/duration/timing/severity/associated sxs/prior Treatment) HPI 63 year old male presents with acute respiratory distress. A couple hours prior to arrival the patient had an acute onset of shortness of breath and chest pain. He states that throughout the day he had some intermittent mild chest pain shortness of breath but acutely worsened. Is unable to lay flat now. Denies leg swelling or leg pain. EMS found patient cyanotic and his severe risk for distress. The try patient on Cipro but he would not tolerate this due to the pressure. He's currently on a nonrebreather with O2 sats in the low 80s. Patient was initially very hypertensive with systolic blood pressure over 200 per EMS which came down with nitroglycerin. Patient denies a prior cardiac or lung history.  No past medical history on file. No past surgical history on file. No family history on file. History  Substance Use Topics  . Smoking status: Not on file  . Smokeless tobacco: Not on file  . Alcohol Use: Not on file    Review of Systems  Unable to perform ROS: Severe respiratory distress      Allergies  Review of patient's allergies indicates not on file.  Home Medications   Prior to Admission medications   Not on File   BP 135/98 mmHg  Pulse 46  Resp 36  Ht 6' (1.829 m)  Wt 185 lb (83.915 kg)  BMI 25.08 kg/m2  SpO2 98% Physical Exam  Constitutional: He is oriented to person, place, and time. He appears well-developed and well-nourished. He appears distressed.  HENT:  Head: Normocephalic and atraumatic.  Right Ear: External ear normal.  Left Ear: External ear normal.  Nose: Nose normal.  Eyes: Right eye exhibits no discharge. Left eye exhibits no discharge.  Neck: Neck supple.   Cardiovascular: Regular rhythm, normal heart sounds and intact distal pulses.  Tachycardia present.   Pulmonary/Chest: Accessory muscle usage present. Tachypnea noted. He is in respiratory distress. He has rales in the right upper field, the right middle field, the right lower field, the left upper field, the left middle field and the left lower field.  Abdominal: Soft. There is no tenderness.  Musculoskeletal: He exhibits no edema or tenderness.  Neurological: He is alert and oriented to person, place, and time.  Skin: Skin is warm. He is diaphoretic.  Nursing note and vitals reviewed.   ED Course  Procedures (including critical care time) Labs Review Labs Reviewed  COMPREHENSIVE METABOLIC PANEL - Abnormal; Notable for the following:    CO2 16 (*)    Glucose, Bld 238 (*)    Creatinine, Ser 1.46 (*)    Total Protein 8.6 (*)    AST 55 (*)    ALT 66 (*)    GFR calc non Af Amer 49 (*)    GFR calc Af Amer 56 (*)    Anion gap 24 (*)    All other components within normal limits  PRO B NATRIURETIC PEPTIDE - Abnormal; Notable for the following:    Pro B Natriuretic peptide (BNP) 6836.0 (*)    All other components within normal limits  CBC WITH DIFFERENTIAL - Abnormal; Notable for the following:    WBC 13.3 (*)    Hemoglobin 17.1 (*)    Neutrophils Relative % 38 (*)  Lymphocytes Relative 55 (*)    Lymphs Abs 7.3 (*)    All other components within normal limits  I-STAT ARTERIAL BLOOD GAS, ED - Abnormal; Notable for the following:    pH, Arterial 7.146 (*)    pCO2 arterial 49.2 (*)    pO2, Arterial 139.0 (*)    Bicarbonate 17.0 (*)    Acid-base deficit 12.0 (*)    All other components within normal limits  I-STAT CG4 LACTIC ACID, ED - Abnormal; Notable for the following:    Lactic Acid, Venous 8.61 (*)    All other components within normal limits  I-STAT CHEM 8, ED - Abnormal; Notable for the following:    Potassium 3.4 (*)    Creatinine, Ser 1.60 (*)    Glucose, Bld 229 (*)     Hemoglobin 19.7 (*)    HCT 58.0 (*)    All other components within normal limits  I-STAT TROPOININ, ED - Abnormal; Notable for the following:    Troponin i, poc 0.43 (*)    All other components within normal limits  TROPONIN I  PROTIME-INR  URINE RAPID DRUG SCREEN (HOSP PERFORMED)  HEPARIN LEVEL (UNFRACTIONATED)  URINALYSIS, ROUTINE W REFLEX MICROSCOPIC  I-STAT TROPOININ, ED  I-STAT CG4 LACTIC ACID, ED  TYPE AND SCREEN  ABO/RH    Imaging Review Dg Chest Portable 1 View  07/26/2014   CLINICAL DATA:  Hypoxia.  EXAM: PORTABLE CHEST - 1 VIEW  COMPARISON:  None.  FINDINGS: Heart size is normal. Pulmonary vascularity appears normal. Diffuse interstitial and airspace disease in the lungs suggesting edema versus diffuse pneumonia. No blunting of visualize costophrenic angles. No pneumothorax.  IMPRESSION: Diffuse interstitial and airspace disease in the lungs suggesting edema versus pneumonia.   Electronically Signed   By: Lucienne Capers M.D.   On: 07/26/2014 02:11     EKG Interpretation   Date/Time:  Tuesday July 26 2014 01:53:55 EST Ventricular Rate:  130 PR Interval:  126 QRS Duration: 124 QT Interval:  318 QTC Calculation: 468 R Axis:   -66 Text Interpretation:  Sinus tachycardia Multiple premature complexes, vent   Left bundle branch block Baseline wander in lead(s) III No old tracing to  compare Confirmed by Aparna Vanderweele  MD, Charde Macfarlane ([id number]) on 07/26/2014 1:57:06 AM      CRITICAL CARE Performed by: Sherwood Gambler T   Total critical care time: 45 minutes  Critical care time was exclusive of separately billable procedures and treating other patients.  Critical care was necessary to treat or prevent imminent or life-threatening deterioration.  Critical care was time spent personally by me on the following activities: development of treatment plan with patient and/or surrogate as well as nursing, discussions with consultants, evaluation of patient's response to treatment,  examination of patient, obtaining history from patient or surrogate, ordering and performing treatments and interventions, ordering and review of laboratory studies, ordering and review of radiographic studies, pulse oximetry and re-evaluation of patient's condition.  MDM   Final diagnoses:  Hypoxia  Acute pulmonary edema    Patient was able to tolerate BiPAP here after starting at low pressures in gradually increasing. This in addition to nitroglycerin IV drip helped reduce his work of breathing and make patient more comfortable. His history and workup are consistent with acute pulmonary edema. Given his chest pain there is concern for coronary pathology. I discussed with cardiology, Dr. Elias Else, who is evaluated the patient and the ER. He notes the patient told him if he did do crack earlier tonight. Patient  will start on heparin after a second troponin was done to evaluate to see if there is any cardiac damage per cardiology. Patient has improved significantly with blood pressure control but is still ill and requires ICU admission.    Ephraim Hamburger, MD 07/26/14 [phone]

## 2014-07-26 NOTE — Progress Notes (Signed)
ANTICOAGULATION CONSULT NOTE - Follow Up Consult  Pharmacy Consult for Heparin Indication: chest pain/ACS  No Known Allergies  Patient Measurements: Height: 6' (182.9 cm) Weight: 176 lb 2.4 oz (79.9 kg) IBW/kg (Calculated) : 77.6  Heparin dosing weight: 79kg  Vital Signs: Temp: 97.8 F (36.6 C) (11/03 1519) Temp Source: Oral (11/03 1519) BP: 116/58 mmHg (11/03 1700) Pulse Rate: 60 (11/03 1700)  Labs:  Recent Labs  07/26/14 0151 07/26/14 0200 07/26/14 0842 07/26/14 1022 07/26/14 1403 07/26/14 1650  HGB 17.1* 19.7*  --   --   --   --   HCT 50.4 58.0*  --   --   --   --   PLT 156  --   --   --   --   --   LABPROT 13.8  --   --   --   --   --   INR 1.05  --   --   --   --   --   HEPARINUNFRC  --   --   --  0.31  --  0.14*  CREATININE 1.46* 1.60*  --   --   --   --   TROPONINI <0.30  --  1.71*  --  1.59*  --     Estimated Creatinine Clearance: 50.5 mL/min (by C-G formula based on Cr of 1.6).  Assessment: 63 yo M presents on 11/3 with chest pain / ACS continues on heparin drip. HL this morning was therapeutic, but a confirmatory level this afternoon dropped to 0.14 units/mL. No issues with line per RN. No bleeding noted.  Goal of Therapy:  Heparin level 0.3-0.7 units/ml Monitor platelets by anticoagulation protocol: Yes   Plan:   1. Heparin bolus with 2000 units IV x1, then increase rate to 1250units/hr 2. Heparin level in 6 hours 3. Daily heparin level and CBC 4. Follow for s/s bleeding and cath plans  Edwyna Dangerfield D. [place] Revoir, PharmD, BCPS Clinical Pharmacist Pager: [phone] 07/26/2014 5:55 PM

## 2014-07-26 NOTE — ED Notes (Signed)
Pt st's substernal chest pain comes and goes.  St's right now pain is a #4 on pain scale 0/10.  NTG gtt continues at 25mcg/min

## 2014-07-26 NOTE — Progress Notes (Signed)
Pt sats between97-99% and is not labored breathing and stated he did not want to wear BiPAP at this time. RT will continue to monitor

## 2014-07-26 NOTE — ED Notes (Signed)
MD at bedside. 

## 2014-07-27 ENCOUNTER — Encounter (HOSPITAL_COMMUNITY)
Admission: EM | Disposition: A | Discharge status: Home / Self Care | Payer: Self-pay | Source: Home / Self Care | Attending: Cardiology

## 2014-07-27 ENCOUNTER — Encounter (HOSPITAL_COMMUNITY): Payer: Self-pay | Admitting: Interventional Cardiology

## 2014-07-27 DIAGNOSIS — I214 Non-ST elevation (NSTEMI) myocardial infarction: Secondary | ICD-10-CM | POA: Diagnosis present

## 2014-07-27 DIAGNOSIS — J810 Acute pulmonary edema: Secondary | ICD-10-CM | POA: Diagnosis present

## 2014-07-27 DIAGNOSIS — I420 Dilated cardiomyopathy: Secondary | ICD-10-CM | POA: Diagnosis present

## 2014-07-27 DIAGNOSIS — R740 Nonspecific elevation of levels of transaminase and lactic acid dehydrogenase [LDH]: Secondary | ICD-10-CM

## 2014-07-27 DIAGNOSIS — N182 Chronic kidney disease, stage 2 (mild): Secondary | ICD-10-CM | POA: Diagnosis present

## 2014-07-27 DIAGNOSIS — R7401 Elevation of levels of liver transaminase levels: Secondary | ICD-10-CM | POA: Diagnosis present

## 2014-07-27 DIAGNOSIS — R0989 Other specified symptoms and signs involving the circulatory and respiratory systems: Secondary | ICD-10-CM | POA: Insufficient documentation

## 2014-07-27 DIAGNOSIS — R0600 Dyspnea, unspecified: Secondary | ICD-10-CM | POA: Diagnosis present

## 2014-07-27 DIAGNOSIS — N189 Chronic kidney disease, unspecified: Secondary | ICD-10-CM

## 2014-07-27 DIAGNOSIS — E876 Hypokalemia: Secondary | ICD-10-CM

## 2014-07-27 DIAGNOSIS — I2510 Atherosclerotic heart disease of native coronary artery without angina pectoris: Secondary | ICD-10-CM

## 2014-07-27 HISTORY — PX: LEFT HEART CATHETERIZATION WITH CORONARY ANGIOGRAM: SHX5451

## 2014-07-27 LAB — CBC
HCT: 41.8 % (ref 39.0–52.0)
Hemoglobin: 14.5 g/dL (ref 13.0–17.0)
MCH: 30.3 pg (ref 26.0–34.0)
MCHC: 34.7 g/dL (ref 30.0–36.0)
MCV: 87.3 fL (ref 78.0–100.0)
Platelets: 120 10*3/uL — ABNORMAL LOW (ref 150–400)
RBC: 4.79 MIL/uL (ref 4.22–5.81)
RDW: 13.1 % (ref 11.5–15.5)
WBC: 8.9 10*3/uL (ref 4.0–10.5)

## 2014-07-27 LAB — BASIC METABOLIC PANEL
Anion gap: 11 (ref 5–15)
BUN: 15 mg/dL (ref 6–23)
CALCIUM: 8.7 mg/dL (ref 8.4–10.5)
CO2: 24 mEq/L (ref 19–32)
Chloride: 101 mEq/L (ref 96–112)
Creatinine, Ser: 1.09 mg/dL (ref 0.50–1.35)
GFR calc non Af Amer: 69 mL/min — ABNORMAL LOW (ref >90)
GFR, EST AFRICAN AMERICAN: 80 mL/min — AB (ref >90)
Glucose, Bld: 100 mg/dL — ABNORMAL HIGH (ref 70–99)
POTASSIUM: 3.7 meq/L (ref 3.7–5.3)
SODIUM: 136 meq/L — AB (ref 137–147)

## 2014-07-27 LAB — HEPARIN LEVEL (UNFRACTIONATED)
HEPARIN UNFRACTIONATED: 0.45 [IU]/mL (ref 0.30–0.70)
HEPARIN UNFRACTIONATED: 0.35 [IU]/mL (ref 0.30–0.70)
HEPARIN UNFRACTIONATED: 0.4 [IU]/mL (ref 0.30–0.70)

## 2014-07-27 LAB — GLUCOSE, CAPILLARY: GLUCOSE-CAPILLARY: 105 mg/dL — AB (ref 70–99)

## 2014-07-27 SURGERY — LEFT HEART CATHETERIZATION WITH CORONARY ANGIOGRAM
Anesthesia: LOCAL

## 2014-07-27 MED ORDER — HEPARIN SODIUM (PORCINE) 1000 UNIT/ML IJ SOLN
INTRAMUSCULAR | Status: AC
  Filled 2014-07-27: qty 1

## 2014-07-27 MED ORDER — LIDOCAINE HCL (PF) 1 % IJ SOLN
INTRAMUSCULAR | Status: AC
  Filled 2014-07-27: qty 30

## 2014-07-27 MED ORDER — VERAPAMIL HCL 2.5 MG/ML IV SOLN
INTRAVENOUS | Status: AC
  Filled 2014-07-27: qty 2

## 2014-07-27 MED ORDER — ASPIRIN 81 MG PO CHEW
81.0000 mg | CHEWABLE_TABLET | ORAL | Status: AC
  Administered 2014-07-27: 81 mg via ORAL
  Filled 2014-07-27: qty 1

## 2014-07-27 MED ORDER — POTASSIUM CHLORIDE CRYS ER 20 MEQ PO TBCR
40.0000 meq | EXTENDED_RELEASE_TABLET | Freq: Once | ORAL | Status: AC
  Administered 2014-07-27: 40 meq via ORAL
  Filled 2014-07-27: qty 2

## 2014-07-27 MED ORDER — NITROGLYCERIN 1 MG/10 ML FOR IR/CATH LAB
INTRA_ARTERIAL | Status: AC
  Filled 2014-07-27: qty 10

## 2014-07-27 MED ORDER — LISINOPRIL 10 MG PO TABS
10.0000 mg | ORAL_TABLET | Freq: Every day | ORAL | Status: DC
  Administered 2014-07-28 – 2014-07-29 (×2): 10 mg via ORAL
  Filled 2014-07-27 (×2): qty 1

## 2014-07-27 MED ORDER — FENTANYL CITRATE 0.05 MG/ML IJ SOLN
INTRAMUSCULAR | Status: AC
  Filled 2014-07-27: qty 2

## 2014-07-27 MED ORDER — HEPARIN (PORCINE) IN NACL 2-0.9 UNIT/ML-% IJ SOLN
INTRAMUSCULAR | Status: AC
  Filled 2014-07-27: qty 500

## 2014-07-27 MED ORDER — ONDANSETRON HCL 4 MG/2ML IJ SOLN: 4.0000 mg | Freq: Four times a day (QID) | INTRAMUSCULAR | Status: DC | PRN

## 2014-07-27 MED ORDER — HEPARIN (PORCINE) IN NACL 100-0.45 UNIT/ML-% IJ SOLN
1500.0000 [IU]/h | INTRAMUSCULAR | Status: DC
  Administered 2014-07-27: 1250 [IU]/h via INTRAVENOUS
  Filled 2014-07-27 (×2): qty 250

## 2014-07-27 MED ORDER — SODIUM CHLORIDE 0.9 % IJ SOLN: 3.0000 mL | INTRAMUSCULAR | Status: DC | PRN

## 2014-07-27 MED ORDER — MIDAZOLAM HCL 2 MG/2ML IJ SOLN
INTRAMUSCULAR | Status: AC
  Filled 2014-07-27: qty 2

## 2014-07-27 MED ORDER — SODIUM CHLORIDE 0.9 % IV SOLN: 1.0000 mL/kg/h | INTRAVENOUS | Status: AC

## 2014-07-27 MED ORDER — ASPIRIN EC 81 MG PO TBEC
81.0000 mg | DELAYED_RELEASE_TABLET | Freq: Every day | ORAL | Status: DC
  Administered 2014-07-28 – 2014-07-29 (×2): 81 mg via ORAL
  Filled 2014-07-27 (×2): qty 1

## 2014-07-27 MED ORDER — SODIUM CHLORIDE 0.9 % IJ SOLN
3.0000 mL | Freq: Two times a day (BID) | INTRAMUSCULAR | Status: DC
  Administered 2014-07-27: 3 mL via INTRAVENOUS

## 2014-07-27 MED ORDER — SODIUM CHLORIDE 0.9 % IV SOLN: 250.0000 mL | INTRAVENOUS | Status: DC | PRN

## 2014-07-27 MED ORDER — SODIUM CHLORIDE 0.9 % IV SOLN: INTRAVENOUS | Status: DC

## 2014-07-27 NOTE — Clinical Documentation Improvement (Signed)
Please clarify respiratory status. Thank you  . Document acuity: --Acute respiratory failure --Chronic respiratory failure --Acute on chronic respiratory failure  . Document inclusion of: --Hypoxia --Hypercapnia  . Document any associated diagnoses/conditions  Supporting Information:  Per ED Provider note:  63 year old male presents with acute respiratory distress. A couple hours prior to arrival the patient had an acute onset of shortness of breath and chest pain. Pulmonary/Chest: Accessory muscle usage present. Tachypnea noted. He is in respiratory distress. He has rales in the right upper field, the right middle field, the right lower field, the left upper field, the left middle field and the left lower field.   I-STAT ARTERIAL BLOOD GAS, ED - Abnormal; Notable for the following:    pH, Arterial 7.146 (*)    pCO2 arterial 49.2 (*)    pO2, Arterial 139.0 (*)    Bicarbonate 17.0 (*)    Acid-base deficit 12.0 (*)    Final diagnoses:  Hypoxia  Acute pulmonary edema   Patient was able to tolerate BiPAP here after starting at low pressures in gradually increasing. This in addition to nitroglycerin IV drip helped reduce his work of breathing and make patient more comfortable. His history and workup are consistent with acute pulmonary edema.  History of smoking tobacco & crack cocaine Respirations: 36 on admission  Per H&P:  Problem List 1. Acute respiratory distress with hypoxia and hypercarbia 8. Respiratory and metabolic acidosis  CXR 03/0: : Diffuse interstitial and airspace disease in the lungs suggesting edema versus pneumonia.  Treatments BIPAP/O2 via [place] [facility]G  Thank You,  Virjean Boman T. Pricilla Handler, MSN, MBA/MHA Clinical Documentation Specialist [email or web address] Office # [phone]

## 2014-07-27 NOTE — CV Procedure (Signed)
     Left Heart Catheterization with Coronary Angiography  Report  Jeffery Conrad  63 y.o.  male 09/10/1951  Procedure Date: 07/27/2014 Referring Physician: Casandra Doffing, MD  Primary Cardiologist: Same  INDICATIONS: Acute heart failure with elevated cardiac markers. Cocaine use. Tobacco use.  PROCEDURE: 1. Left heart catheterization; 2. Coronary angiography; 3. Left ventriculography  CONSENT:  The risks, benefits, and details of the procedure were explained in detail to the patient. Risks including death, stroke, heart attack, kidney injury, allergy, limb ischemia, bleeding and radiation injury were discussed.  The patient verbalized understanding and wanted to proceed.  Informed written consent was obtained.  PROCEDURE TECHNIQUE:  After Xylocaine anesthesia a 5 French Slender sheath was placed in the left radial artery with an angiocath and the modified Seldinger technique.  Coronary angiography was done using a 5 F JR 4 and JL 3.5 catheter.  Left ventriculography was done using the JR4 catheter and hand injection.   Images were reviewed. The case was terminated.   CONTRAST:  Total of 100 cc.  COMPLICATIONS:  none   HEMODYNAMICS:  Aortic pressure 140/68 mmHg; LV pressure 140/0 mmHg; LVEDP 9 mmHg  ANGIOGRAPHIC DATA:   The left main coronary artery is heavily calcified. No obstruction is noted..  The left anterior descending artery is heavily calcified. It wraps around the left ventricular apex. There is a bifurcation lesion in the proximal LAD with the first diagonal that is a [place] 0, 1, 1 (with 0, 50, and 50-70% stenosis).LAD is transapical. No significant obstructions are felt to be present.  The left circumflex artery is moderately calcified. A widely patent first obtuse marginal is noted. The continuation of the circumflex is also widely patent.  The right coronary artery is patent. It has a double ostium that includes a large acute marginal branch and the right  coronary artery proper. The right coronary is difficult to cannulate. The distal right coronary before the PDA contains segmental 50% narrowing.   LEFT VENTRICULOGRAM:  Left ventricular angiogram was done in the 30 RAO projection and revealed a dilated left ventricle with reduced systolic function. Left ventricular opacification was by hand injection and was in adequate. We know that the patient's LVEF is 25% by echocardiography. The angiographic images obtained confirm a similar finding.   IMPRESSIONS:  1. Moderate proximal LAD disease bifurcation stenosis. Mild to moderate distal RCA disease. 2. Severe left ventricular dysfunction with EF 20-25% but normal left ventricular end-diastolic pressures. 3. Heavily calcified left coronary   RECOMMENDATION:  Medical therapy. Risk factor modification. Social services assistance.Marland Kitchen

## 2014-07-27 NOTE — Progress Notes (Addendum)
ANTICOAGULATION CONSULT NOTE - Follow Up Consult  Pharmacy Consult for Heparin Indication: chest pain/ACS  No Known Allergies  Patient Measurements: Height: 6' (182.9 cm) Weight: 172 lb 9.9 oz (78.3 kg) IBW/kg (Calculated) : 77.6 Heparin Dosing Weight: 79  Vital Signs: Temp: 98 F (36.7 C) (11/04 0742) Temp Source: Oral (11/04 0742) BP: 153/77 mmHg (11/04 1000) Pulse Rate: 68 (11/04 1000)  Labs:  Recent Labs  07/26/14 0151 07/26/14 0200 07/26/14 0842  07/26/14 1403  07/26/14 2050 07/27/14 0102 07/27/14 0236 07/27/14 0946  HGB 17.1* 19.7*  --   --   --   --   --  14.5  --   --   HCT 50.4 58.0*  --   --   --   --   --  41.8  --   --   PLT 156  --   --   --   --   --   --  120*  --   --   LABPROT 13.8  --   --   --   --   --   --   --   --   --   INR 1.05  --   --   --   --   --   --   --   --   --   HEPARINUNFRC  --   --   --   < >  --   < >  --  0.45 0.35 0.40  CREATININE 1.46* 1.60*  --   --   --   --   --  1.09  --   --   TROPONINI <0.30  --  1.71*  --  1.59*  --  1.32*  --   --   --   < > = values in this interval not displayed.  Estimated Creatinine Clearance: 74.2 mL/min (by C-G formula based on Cr of 1.09).  Assessment: 63 yo M presents on 11/3 with chest pain / ACS continues on heparin drip. Confirmatory HL this morning was therapeutic at 0.4. H/H stable, plts trending down to 120. No s/s of bleed.  Goal of Therapy:  Heparin level 0.3-0.7 units/ml Monitor platelets by anticoagulation protocol: Yes   Plan:  Continue heparin drip at 1,250 units/hr Monitor daily HL, CBC, s/s of bleed F/U cath plans  Champ Keetch J 07/27/2014,10:49 AM  Pt went to cath lab today. Plan to restart heparin gtt 8 hours post TR band removal which will occur around 1400.  Plan: Restart heparin drip at 1,250 units/hr @2200  Check HL 6 hours after start with AM labs Monitor daily HL, CBC, s/s of bleed  Elenor Quinones, PharmD Clinical Pharmacist Resident Pager  [phone] 07/27/2014 1:53 PM

## 2014-07-27 NOTE — H&P (View-Only) (Signed)
SUBJECTIVE:  Pt seen and examined in AM. No acute events overnight on telemetry. Pt with elevated troponins x 3 yesterday. Echo revealed 25-30% EF with grade 1 diastolic dysfunction. Currently NPO for catherization today.  Blood pressure are trending up with wean off nitro drip. Currently denies chest pain or dyspnea (on 2L [place])  OBJECTIVE:   Vitals:   Filed Vitals:   07/27/14 0500 07/27/14 0600 07/27/14 0700 07/27/14 0742  BP: 144/63 130/67 155/79   Pulse: 61 58 60   Temp:    98 F (36.7 C)  TempSrc:    Oral  Resp: 19 18 18    Height:      Weight:      SpO2: 96% 97% 95%    I&O's:   Intake/Output Summary (Last 24 hours) at 07/27/14 0836 Last data filed at 07/27/14 0700  Gross per 24 hour  Intake 946.31 ml  Output   3375 ml  Net -2428.69 ml   TELEMETRY: Reviewed telemetry pt in normal sinus rhythm:     PHYSICAL EXAM General: Well developed, well nourished, in no acute distress Head:   Normal cephalic and atramatic  Lungs:  Clear bilaterally to auscultation. On 2L [place].  Heart:  HRRR S1 S2  No JVD.   Abdomen: Abdomen soft and non-tender Msk:  Back normal,  Normal strength and tone for age. Extremities:  No edema.   Neuro: Alert and oriented. Psych:  Normal affect, responds appropriately Skin: No rash   LABS: Basic Metabolic Panel:  Recent Labs  07/26/14 0151 07/26/14 0200 07/27/14 0102  NA 138 140 136*  K 4.0 3.4* 3.7  CL 98 103 101  CO2 16*  --  24  GLUCOSE 238* 229* 100*  BUN 10 8 15   CREATININE 1.46* 1.60* 1.09  CALCIUM 9.3  --  8.7   Liver Function Tests:  Recent Labs  07/26/14 0151  AST 55*  ALT 66*  ALKPHOS 97  BILITOT 0.5  PROT 8.6*  ALBUMIN 3.7   No results for input(s): LIPASE, AMYLASE in the last 72 hours. CBC:  Recent Labs  07/26/14 0151 07/26/14 0200 07/27/14 0102  WBC 13.3*  --  8.9  NEUTROABS 5.1  --   --   HGB 17.1* 19.7* 14.5  HCT 50.4 58.0* 41.8  MCV 91.6  --  87.3  PLT 156  --  120*   Cardiac Enzymes:  Recent  Labs  07/26/14 0842 07/26/14 1403 07/26/14 2050  TROPONINI 1.71* 1.59* 1.32*   BNP: Invalid input(s): POCBNP D-Dimer: No results for input(s): DDIMER in the last 72 hours. Hemoglobin A1C:  Recent Labs  07/26/14 0842  HGBA1C 5.6   Fasting Lipid Panel:  Recent Labs  07/26/14 0842  CHOL 127  HDL 43  LDLCALC 74  TRIG 50  CHOLHDL 3.0   Thyroid Function Tests: No results for input(s): TSH, T4TOTAL, T3FREE, THYROIDAB in the last 72 hours.  Invalid input(s): FREET3 Anemia Panel: No results for input(s): VITAMINB12, FOLATE, FERRITIN, TIBC, IRON, RETICCTPCT in the last 72 hours. Coag Panel:   Lab Results  Component Value Date   INR 1.05 07/26/2014    RADIOLOGY: Dg Chest Portable 1 View  07/26/2014   CLINICAL DATA:  Hypoxia.  EXAM: PORTABLE CHEST - 1 VIEW  COMPARISON:  None.  FINDINGS: Heart size is normal. Pulmonary vascularity appears normal. Diffuse interstitial and airspace disease in the lungs suggesting edema versus diffuse pneumonia. No blunting of visualize costophrenic angles. No pneumothorax.  IMPRESSION: Diffuse interstitial and airspace disease  in the lungs suggesting edema versus pneumonia.   Electronically Signed   By: Lucienne Capers M.D.   On: 07/26/2014 02:11      ASSESSMENT: 63 yo man with pmh of COPD and cocaine abuse who presented on 11/3 with chest pain in setting of recent cocaine use found to have hypertensive emergency, elevated cardiac enzymes, and EKG changes concerning for ACS.   PLAN:    NSTEMI in setting of cocaine abuse - No current CP. Plan for heart catherization later today, currently NPO on IV heparin. May add plavix to aspirin 81 mg daily following results of catherization. Currently on simvastatin 20 mg daily, consider high intensity statin (atorvastatin) pending results of catherization. Counsel on cocaine abuse cessation.  Hypertensive Emergency - 1555/79 today improved from 186/134 on admission. Currently on amlodipine 5 mg daily and  weaning off nitro drip. Will add lisinopril 10 mg daily tomm in AM for reduced LV function. Hold BB for now.   Left Ventricular systolic and diastolic dysfunction - 2D echo with EF  25-30% with grade 1 diastolic dysfunction in setting of cocaine abuse, uncontrolled HTN, and possible CAD. Pro-BNP elveated at >6000 with pulmonary edema on CXR. Will add lisinopril 10 mg daily tomm in AM for reduced LV function and hold BB for now (consider carvedilol). Will hold IV lasix 40 mg daily for catherization today, consider resuming tomm. Repeat CXR in AM.   Hypokalemia - K 3.7 today, kdur 40 mEq x 1.  Obtain Mg level. Thrombyocytopenia - 120K today with no active bleeding. Continue to monitor in setting of heparin use.  Elevated AST/ALT - Obtain acute hepatitis panel, HIV AB, and continue to monitor. Etiology unclear, possibly due to alcohol vs fatty liver disease vs viral hepatitis.  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 Renal function currently stable.  COPD - Not current exacerbation, on oxygen therapy via [place].  Juluis Mire, MD  07/27/2014  8:36 AM   I have examined the patient and reviewed assessment and plan and discussed with patient.  Agree with above as stated.  Decreasing NTG.  Will start lisinopril post cath.  Cath today given low EF and increased troponin.  May be HTN emergency causing troponin but need to r/o CAD.  Procedure explained to the patient and all questions were answered this morning. Down the rroad, will need additional meds for cardiomyopathy.  Jeana Kersting S.

## 2014-07-27 NOTE — Interval H&P Note (Signed)
Cath Lab Visit (complete for each Cath Lab visit)  Clinical Evaluation Leading to the Procedure:   ACS: Yes.    Non-ACS:    Anginal Classification: CCS III  Anti-ischemic medical therapy: Maximal Therapy (2 or more classes of medications)  Non-Invasive Test Results: No non-invasive testing performed  Prior CABG: No previous CABG      History and Physical Interval Note:  07/27/2014 11:34 AM  Jeffery Conrad  has presented today for surgery, with the diagnosis of cp  The various methods of treatment have been discussed with the patient and family. After consideration of risks, benefits and other options for treatment, the patient has consented to  Procedure(s): LEFT HEART CATHETERIZATION WITH CORONARY ANGIOGRAM (N/A) as a surgical intervention .  The patient's history has been reviewed, patient examined, no change in status, stable for surgery.  I have reviewed the patient's chart and labs.  Questions were answered to the patient's satisfaction.     Sinclair Grooms

## 2014-07-27 NOTE — Progress Notes (Signed)
SUBJECTIVE:  Pt seen and examined in AM. No acute events overnight on telemetry. Pt with elevated troponins x 3 yesterday. Echo revealed 25-30% EF with grade 1 diastolic dysfunction. Currently NPO for catherization today.  Blood pressure are trending up with wean off nitro drip. Currently denies chest pain or dyspnea (on 2L [place])  OBJECTIVE:   Vitals:   Filed Vitals:   07/27/14 0500 07/27/14 0600 07/27/14 0700 07/27/14 0742  BP: 144/63 130/67 155/79   Pulse: 61 58 60   Temp:    98 F (36.7 C)  TempSrc:    Oral  Resp: 19 18 18    Height:      Weight:      SpO2: 96% 97% 95%    I&O's:   Intake/Output Summary (Last 24 hours) at 07/27/14 0836 Last data filed at 07/27/14 0700  Gross per 24 hour  Intake 946.31 ml  Output   3375 ml  Net -2428.69 ml   TELEMETRY: Reviewed telemetry pt in normal sinus rhythm:     PHYSICAL EXAM General: Well developed, well nourished, in no acute distress Head:   Normal cephalic and atramatic  Lungs:  Clear bilaterally to auscultation. On 2L [place].  Heart:  HRRR S1 S2  No JVD.   Abdomen: Abdomen soft and non-tender Msk:  Back normal,  Normal strength and tone for age. Extremities:  No edema.   Neuro: Alert and oriented. Psych:  Normal affect, responds appropriately Skin: No rash   LABS: Basic Metabolic Panel:  Recent Labs  07/26/14 0151 07/26/14 0200 07/27/14 0102  NA 138 140 136*  K 4.0 3.4* 3.7  CL 98 103 101  CO2 16*  --  24  GLUCOSE 238* 229* 100*  BUN 10 8 15   CREATININE 1.46* 1.60* 1.09  CALCIUM 9.3  --  8.7   Liver Function Tests:  Recent Labs  07/26/14 0151  AST 55*  ALT 66*  ALKPHOS 97  BILITOT 0.5  PROT 8.6*  ALBUMIN 3.7   No results for input(s): LIPASE, AMYLASE in the last 72 hours. CBC:  Recent Labs  07/26/14 0151 07/26/14 0200 07/27/14 0102  WBC 13.3*  --  8.9  NEUTROABS 5.1  --   --   HGB 17.1* 19.7* 14.5  HCT 50.4 58.0* 41.8  MCV 91.6  --  87.3  PLT 156  --  120*   Cardiac Enzymes:  Recent  Labs  07/26/14 0842 07/26/14 1403 07/26/14 2050  TROPONINI 1.71* 1.59* 1.32*   BNP: Invalid input(s): POCBNP D-Dimer: No results for input(s): DDIMER in the last 72 hours. Hemoglobin A1C:  Recent Labs  07/26/14 0842  HGBA1C 5.6   Fasting Lipid Panel:  Recent Labs  07/26/14 0842  CHOL 127  HDL 43  LDLCALC 74  TRIG 50  CHOLHDL 3.0   Thyroid Function Tests: No results for input(s): TSH, T4TOTAL, T3FREE, THYROIDAB in the last 72 hours.  Invalid input(s): FREET3 Anemia Panel: No results for input(s): VITAMINB12, FOLATE, FERRITIN, TIBC, IRON, RETICCTPCT in the last 72 hours. Coag Panel:   Lab Results  Component Value Date   INR 1.05 07/26/2014    RADIOLOGY: Dg Chest Portable 1 View  07/26/2014   CLINICAL DATA:  Hypoxia.  EXAM: PORTABLE CHEST - 1 VIEW  COMPARISON:  None.  FINDINGS: Heart size is normal. Pulmonary vascularity appears normal. Diffuse interstitial and airspace disease in the lungs suggesting edema versus diffuse pneumonia. No blunting of visualize costophrenic angles. No pneumothorax.  IMPRESSION: Diffuse interstitial and airspace disease  in the lungs suggesting edema versus pneumonia.   Electronically Signed   By: Lucienne Capers M.D.   On: 07/26/2014 02:11      ASSESSMENT: 63 yo man with pmh of COPD and cocaine abuse who presented on 11/3 with chest pain in setting of recent cocaine use found to have hypertensive emergency, elevated cardiac enzymes, and EKG changes concerning for ACS.   PLAN:    NSTEMI in setting of cocaine abuse - No current CP. Plan for heart catherization later today, currently NPO on IV heparin. May add plavix to aspirin 81 mg daily following results of catherization. Currently on simvastatin 20 mg daily, consider high intensity statin (atorvastatin) pending results of catherization. Counsel on cocaine abuse cessation.  Hypertensive Emergency - 1555/79 today improved from 186/134 on admission. Currently on amlodipine 5 mg daily and  weaning off nitro drip. Will add lisinopril 10 mg daily tomm in AM for reduced LV function. Hold BB for now.   Left Ventricular systolic and diastolic dysfunction - 2D echo with EF  25-30% with grade 1 diastolic dysfunction in setting of cocaine abuse, uncontrolled HTN, and possible CAD. Pro-BNP elveated at >6000 with pulmonary edema on CXR. Will add lisinopril 10 mg daily tomm in AM for reduced LV function and hold BB for now (consider carvedilol). Will hold IV lasix 40 mg daily for catherization today, consider resuming tomm. Repeat CXR in AM.   Hypokalemia - K 3.7 today, kdur 40 mEq x 1.  Obtain Mg level. Thrombyocytopenia - 120K today with no active bleeding. Continue to monitor in setting of heparin use.  Elevated AST/ALT - Obtain acute hepatitis panel, HIV AB, and continue to monitor. Etiology unclear, possibly due to alcohol vs fatty liver disease vs viral hepatitis.  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 Renal function currently stable.  COPD - Not current exacerbation, on oxygen therapy via [place].  Juluis Mire, MD  07/27/2014  8:36 AM   I have examined the patient and reviewed assessment and plan and discussed with patient.  Agree with above as stated.  Decreasing NTG.  Will start lisinopril post cath.  Cath today given low EF and increased troponin.  May be HTN emergency causing troponin but need to r/o CAD.  Procedure explained to the patient and all questions were answered this morning. Down the rroad, will need additional meds for cardiomyopathy.  VARANASI,JAYADEEP S.

## 2014-07-27 NOTE — Progress Notes (Signed)
ANTICOAGULATION CONSULT NOTE Pharmacy Consult for Heparin Indication: chest pain/ACS  No Known Allergies  Patient Measurements: Height: 6' (182.9 cm) Weight: 176 lb 2.4 oz (79.9 kg) IBW/kg (Calculated) : 77.6  Vital Signs: Temp: 97.8 F (36.6 C) (11/03 2200) Temp Source: Oral (11/03 2200) BP: 117/47 mmHg (11/03 2300) Pulse Rate: 63 (11/04 0000)  Labs:  Recent Labs  07/26/14 0151 07/26/14 0200 07/26/14 0842 07/26/14 1022 07/26/14 1403 07/26/14 1650 07/26/14 2050 07/27/14 0102  HGB 17.1* 19.7*  --   --   --   --   --   --   HCT 50.4 58.0*  --   --   --   --   --   --   PLT 156  --   --   --   --   --   --   --   LABPROT 13.8  --   --   --   --   --   --   --   INR 1.05  --   --   --   --   --   --   --   HEPARINUNFRC  --   --   --  0.31  --  0.14*  --  0.45  CREATININE 1.46* 1.60*  --   --   --   --   --  1.09  TROPONINI <0.30  --  1.71*  --  1.59*  --  1.32*  --     Estimated Creatinine Clearance: 74.2 mL/min (by C-G formula based on Cr of 1.09).   Medications:  None  Assessment: 63 y.o. male with chest pain for heparin    Goal of Therapy:  Heparin level 0.3-0.7 units/ml Monitor platelets by anticoagulation protocol: Yes   Plan:  Continue Heparin at current rate  Follow-up am labs.    Toby Breithaupt, Bronson Curb 07/27/2014,1:54 AM

## 2014-07-28 ENCOUNTER — Encounter (HOSPITAL_COMMUNITY): Payer: Self-pay | Admitting: General Practice

## 2014-07-28 DIAGNOSIS — I5021 Acute systolic (congestive) heart failure: Secondary | ICD-10-CM

## 2014-07-28 DIAGNOSIS — J810 Acute pulmonary edema: Secondary | ICD-10-CM

## 2014-07-28 DIAGNOSIS — I420 Dilated cardiomyopathy: Secondary | ICD-10-CM

## 2014-07-28 DIAGNOSIS — I214 Non-ST elevation (NSTEMI) myocardial infarction: Principal | ICD-10-CM

## 2014-07-28 HISTORY — PX: CARDIAC CATHETERIZATION: SHX172

## 2014-07-28 LAB — CBC
HEMATOCRIT: 45.2 % (ref 39.0–52.0)
HEMOGLOBIN: 15.5 g/dL (ref 13.0–17.0)
MCH: 29.9 pg (ref 26.0–34.0)
MCHC: 34.3 g/dL (ref 30.0–36.0)
MCV: 87.3 fL (ref 78.0–100.0)
Platelets: 134 10*3/uL — ABNORMAL LOW (ref 150–400)
RBC: 5.18 MIL/uL (ref 4.22–5.81)
RDW: 13 % (ref 11.5–15.5)
WBC: 8 10*3/uL (ref 4.0–10.5)

## 2014-07-28 LAB — COMPREHENSIVE METABOLIC PANEL
ALT: 53 U/L (ref 0–53)
AST: 55 U/L — ABNORMAL HIGH (ref 0–37)
Albumin: 3.2 g/dL — ABNORMAL LOW (ref 3.5–5.2)
Alkaline Phosphatase: 62 U/L (ref 39–117)
Anion gap: 16 — ABNORMAL HIGH (ref 5–15)
BUN: 13 mg/dL (ref 6–23)
CO2: 20 meq/L (ref 19–32)
CREATININE: 1.03 mg/dL (ref 0.50–1.35)
Calcium: 9.1 mg/dL (ref 8.4–10.5)
Chloride: 99 mEq/L (ref 96–112)
GFR calc non Af Amer: 74 mL/min — ABNORMAL LOW (ref >90)
GFR, EST AFRICAN AMERICAN: 86 mL/min — AB (ref >90)
GLUCOSE: 99 mg/dL (ref 70–99)
Potassium: 4.6 mEq/L (ref 3.7–5.3)
Sodium: 135 mEq/L — ABNORMAL LOW (ref 137–147)
TOTAL PROTEIN: 7.3 g/dL (ref 6.0–8.3)
Total Bilirubin: 0.8 mg/dL (ref 0.3–1.2)

## 2014-07-28 LAB — HEPATITIS PANEL, ACUTE
HCV AB: REACTIVE — AB
HEP B C IGM: NONREACTIVE
Hep A IgM: NONREACTIVE
Hepatitis B Surface Ag: NEGATIVE

## 2014-07-28 LAB — LACTIC ACID, PLASMA: LACTIC ACID, VENOUS: 1 mmol/L (ref 0.5–2.2)

## 2014-07-28 LAB — MAGNESIUM: MAGNESIUM: 1.9 mg/dL (ref 1.5–2.5)

## 2014-07-28 LAB — HEPARIN LEVEL (UNFRACTIONATED): Heparin Unfractionated: 0.14 IU/mL — ABNORMAL LOW (ref 0.30–0.70)

## 2014-07-28 LAB — HIV ANTIBODY (ROUTINE TESTING W REFLEX): HIV: NONREACTIVE

## 2014-07-28 MED ORDER — CARVEDILOL 3.125 MG PO TABS
3.1250 mg | ORAL_TABLET | Freq: Two times a day (BID) | ORAL | Status: DC
  Administered 2014-07-28 – 2014-07-29 (×4): 3.125 mg via ORAL
  Filled 2014-07-28 (×8): qty 1

## 2014-07-28 MED ORDER — HEPARIN SODIUM (PORCINE) 5000 UNIT/ML IJ SOLN
5000.0000 [IU] | Freq: Three times a day (TID) | INTRAMUSCULAR | Status: DC
Start: 2014-07-28 — End: 2014-07-29
  Administered 2014-07-28 – 2014-07-29 (×4): 5000 [IU] via SUBCUTANEOUS
  Filled 2014-07-28 (×5): qty 1

## 2014-07-28 MED ORDER — FUROSEMIDE 40 MG PO TABS
40.0000 mg | ORAL_TABLET | Freq: Every day | ORAL | Status: DC
  Administered 2014-07-28 – 2014-07-29 (×2): 40 mg via ORAL
  Filled 2014-07-28 (×2): qty 1

## 2014-07-28 MED ORDER — ALBUTEROL SULFATE (2.5 MG/3ML) 0.083% IN NEBU: 3.0000 mL | INHALATION_SOLUTION | Freq: Four times a day (QID) | RESPIRATORY_TRACT | Status: DC | PRN

## 2014-07-28 NOTE — Progress Notes (Addendum)
SUBJECTIVE:  Pt seen and examined in AM. Yesterday he had heart catherization that revealed mild to moderate CAD. No acute events overnight on telemetry. Pt with HR in high 50's and improved blood pressure. Currently on IV heparin and off nitro drip. He denies chest pain, dyspnea, right wrist pain, or LE edema.     OBJECTIVE:   Vitals:   Filed Vitals:   07/28/14 0500 07/28/14 0600 07/28/14 0700 07/28/14 0800  BP: 140/49 123/43 104/45 135/50  Pulse: 58 57 30 55  Temp: 97.5 F (36.4 C)  98.1 F (36.7 C)   TempSrc: Oral  Oral   Resp: 18 20 21 18   Height:      Weight:      SpO2: 97% 97% 96% 95%   I&O's:    Intake/Output Summary (Last 24 hours) at 07/28/14 0816 Last data filed at 07/28/14 0800  Gross per 24 hour  Intake    933 ml  Output   1775 ml  Net   -842 ml   TELEMETRY: Reviewed telemetry pt in normal sinus rhythm:     PHYSICAL EXAM General: Well developed, well nourished, in no acute distress Head:   Normal cephalic and atramatic  Lungs:  Clear bilaterally to auscultation. On 2L [place].  Heart:  HRRR S1 S2  No JVD.   Abdomen: Abdomen soft and non-tender Msk:  Back normal,  Normal strength and tone for age. Extremities:  No LE edema. Right wrist with no swelling, warmth, or erythema, Good radial pulse.   Neuro: Alert and oriented. Psych:  Normal affect, responds appropriately Skin: No rash   LABS: Basic Metabolic Panel:  Recent Labs  07/27/14 0102 07/28/14 0402  NA 136* 135*  K 3.7 4.6  CL 101 99  CO2 24 20  GLUCOSE 100* 99  BUN 15 13  CREATININE 1.09 1.03  CALCIUM 8.7 9.1  MG  --  1.9   Liver Function Tests:  Recent Labs  07/26/14 0151 07/28/14 0402  AST 55* 55*  ALT 66* 53  ALKPHOS 97 62  BILITOT 0.5 0.8  PROT 8.6* 7.3  ALBUMIN 3.7 3.2*   No results for input(s): LIPASE, AMYLASE in the last 72 hours. CBC:  Recent Labs  07/26/14 0151  07/27/14 0102 07/28/14 0402  WBC 13.3*  --  8.9 8.0  NEUTROABS 5.1  --   --   --   HGB 17.1*  < >  14.5 15.5  HCT 50.4  < > 41.8 45.2  MCV 91.6  --  87.3 87.3  PLT 156  --  120* 134*  < > = values in this interval not displayed. Cardiac Enzymes:  Recent Labs  07/26/14 0842 07/26/14 1403 07/26/14 2050  TROPONINI 1.71* 1.59* 1.32*   BNP: Invalid input(s): POCBNP D-Dimer: No results for input(s): DDIMER in the last 72 hours. Hemoglobin A1C:  Recent Labs  07/26/14 0842  HGBA1C 5.6   Fasting Lipid Panel:  Recent Labs  07/26/14 0842  CHOL 127  HDL 43  LDLCALC 74  TRIG 50  CHOLHDL 3.0   Thyroid Function Tests: No results for input(s): TSH, T4TOTAL, T3FREE, THYROIDAB in the last 72 hours.  Invalid input(s): FREET3 Anemia Panel: No results for input(s): VITAMINB12, FOLATE, FERRITIN, TIBC, IRON, RETICCTPCT in the last 72 hours. Coag Panel:   Lab Results  Component Value Date   INR 1.05 07/26/2014    RADIOLOGY: Dg Chest Portable 1 View  07/26/2014   CLINICAL DATA:  Hypoxia.  EXAM: PORTABLE CHEST -  1 VIEW  COMPARISON:  None.  FINDINGS: Heart size is normal. Pulmonary vascularity appears normal. Diffuse interstitial and airspace disease in the lungs suggesting edema versus diffuse pneumonia. No blunting of visualize costophrenic angles. No pneumothorax.  IMPRESSION: Diffuse interstitial and airspace disease in the lungs suggesting edema versus pneumonia.   Electronically Signed   By: Lucienne Capers M.D.   On: 07/26/2014 02:11      ASSESSMENT: 63 yo man with pmh of COPD and cocaine abuse who presented on 11/3 with chest pain in setting of recent cocaine use found to have hypertensive emergency, elevated cardiac enzymes, and EKG changes concerning for ACS found to have CAD on cardiac catherization.   PLAN:    NSTEMI in setting of cocaine abuse and CAD - No current CP. Heart catherization yesterday with evidence of moderate CAD disease (LAD & distal RCA). Discontinue IV heparin, continue aspirin 81 mg daily (no need for plavix). Continue simvastatin 20 mg daily. Pt  counseled on cocaine abuse cessation. Pt to follow-up with Dr. Irish Lack as outpatient. I would be happy to follow him as outpatient at [facility].  Hypertensive Emergency in setting of cocaine abuse- Improved to 132/76. Currently on amlodipine 5 mg daily and lisinopril 10 mg daily (1st dose today). Will add carvedilol 3.125 BID (in setting of cocaine abuse) and transition IV lasix 40 mg daily to PO lasix 40 mg daily.  Left Ventricular systolic Dysfunction & Grade 1 diastolic dysfunction - 2D echo with EF  25-30% with grade 1 diastolic dysfunction in setting of cocaine abuse, uncontrolled HTN, and CAD. Pro-BNP elveated at >6000 with pulmonary edema on CXR. Continue lisinopril 10 mg daily (1st dose today) and add carvedilol 3.125 BID. Transition IV lasix 40 mg daily to PO lasix 40 mg daily.  Repeat CXR pending.   Acute Hypercarbic Respiratory Failure - Resolved. Continue oxygen therapy PRN. Repeat CXR pending. Hypokalemia -Resolved. K 4 today Thrombyocytopenia - Improved to 134K today with no active bleeding. Discontinue IV heparin.  Elevated AST/ALT - Improved. Most likely due to ischemia. Pending acute hepatitis panel and HIV AB.  AKI on CKD Stage II - Resolved. 1.03 from 1.46 on admission most likely pre-renal azotemia. Continue to monitor in setting of starting lisinopril.  COPD - No current exacerbation. Continue oxygen therapy PRN. Add albuterol inhaler Q 6hr PRN.   Stable for transfer out of CCU to telemetry   Juluis Mire, MD  PGY-II IMTS Pager [phone] 07/28/2014  8:16 AM   I have examined the patient and reviewed assessment and plan and discussed with patient.  Agree with above as stated.  Transfer to tele.  No significant CAD.  Needs CHF meds.  No spironolactone until he shows compliance with meds.  Counseling regarding abstinence from cocaine, given.   Shamara Soza S.

## 2014-07-28 NOTE — Progress Notes (Signed)
ANTICOAGULATION CONSULT NOTE - Follow Up Consult  Pharmacy Consult for Heparin Indication: chest pain/ACS  No Known Allergies  Patient Measurements: Height: 6' (182.9 cm) Weight: 174 lb 2.6 oz (79 kg) IBW/kg (Calculated) : 77.6 Heparin Dosing Weight: 79  Vital Signs: Temp: 97.5 F (36.4 C) (11/05 0500) Temp Source: Oral (11/05 0500) BP: 140/49 mmHg (11/05 0500) Pulse Rate: 58 (11/05 0500)  Labs:  Recent Labs  07/26/14 0151 07/26/14 0200 07/26/14 0842  07/26/14 1403  07/26/14 2050 07/27/14 0102 07/27/14 0236 07/27/14 0946 07/28/14 0402 07/28/14 0500  HGB 17.1* 19.7*  --   --   --   --   --  14.5  --   --  15.5  --   HCT 50.4 58.0*  --   --   --   --   --  41.8  --   --  45.2  --   PLT 156  --   --   --   --   --   --  120*  --   --  134*  --   LABPROT 13.8  --   --   --   --   --   --   --   --   --   --   --   INR 1.05  --   --   --   --   --   --   --   --   --   --   --   HEPARINUNFRC  --   --   --   < >  --   < >  --  0.45 0.35 0.40  --  0.14*  CREATININE 1.46* 1.60*  --   --   --   --   --  1.09  --   --  1.03  --   TROPONINI <0.30  --  1.71*  --  1.59*  --  1.32*  --   --   --   --   --   < > = values in this interval not displayed.  Estimated Creatinine Clearance: 78.5 mL/min (by C-G formula based on Cr of 1.03).  Assessment: HL is 0.15 IU this am. No infusion related issues per RN. No bleeding noted. plt's stable as well as H and H.   Goal of Therapy:  Heparin level 0.3-0.7 units/ml Monitor platelets by anticoagulation protocol: Yes   Plan:  Increase to 1500 units/hr  6 hour HL   Curlene Dolphin 07/28/2014,5:52 AM

## 2014-07-29 ENCOUNTER — Encounter (HOSPITAL_COMMUNITY): Payer: Self-pay | Admitting: Cardiology

## 2014-07-29 ENCOUNTER — Telehealth: Payer: Self-pay | Admitting: Nurse Practitioner

## 2014-07-29 DIAGNOSIS — I25110 Atherosclerotic heart disease of native coronary artery with unstable angina pectoris: Secondary | ICD-10-CM

## 2014-07-29 DIAGNOSIS — R739 Hyperglycemia, unspecified: Secondary | ICD-10-CM | POA: Diagnosis present

## 2014-07-29 DIAGNOSIS — J9601 Acute respiratory failure with hypoxia: Secondary | ICD-10-CM | POA: Diagnosis present

## 2014-07-29 DIAGNOSIS — I2510 Atherosclerotic heart disease of native coronary artery without angina pectoris: Secondary | ICD-10-CM | POA: Diagnosis present

## 2014-07-29 DIAGNOSIS — I10 Essential (primary) hypertension: Secondary | ICD-10-CM | POA: Diagnosis present

## 2014-07-29 DIAGNOSIS — F1910 Other psychoactive substance abuse, uncomplicated: Secondary | ICD-10-CM

## 2014-07-29 DIAGNOSIS — I429 Cardiomyopathy, unspecified: Secondary | ICD-10-CM

## 2014-07-29 DIAGNOSIS — I428 Other cardiomyopathies: Secondary | ICD-10-CM

## 2014-07-29 DIAGNOSIS — N179 Acute kidney failure, unspecified: Secondary | ICD-10-CM | POA: Diagnosis present

## 2014-07-29 DIAGNOSIS — F17200 Nicotine dependence, unspecified, uncomplicated: Secondary | ICD-10-CM

## 2014-07-29 LAB — CBC
HCT: 43.2 % (ref 39.0–52.0)
Hemoglobin: 15.1 g/dL (ref 13.0–17.0)
MCH: 30.1 pg (ref 26.0–34.0)
MCHC: 35 g/dL (ref 30.0–36.0)
MCV: 86.1 fL (ref 78.0–100.0)
Platelets: 154 10*3/uL (ref 150–400)
RBC: 5.02 MIL/uL (ref 4.22–5.81)
RDW: 13 % (ref 11.5–15.5)
WBC: 7.6 10*3/uL (ref 4.0–10.5)

## 2014-07-29 LAB — COMPREHENSIVE METABOLIC PANEL
ALT: 56 U/L — ABNORMAL HIGH (ref 0–53)
ANION GAP: 18 — AB (ref 5–15)
AST: 53 U/L — ABNORMAL HIGH (ref 0–37)
Albumin: 3.2 g/dL — ABNORMAL LOW (ref 3.5–5.2)
Alkaline Phosphatase: 66 U/L (ref 39–117)
BUN: 14 mg/dL (ref 6–23)
CALCIUM: 9.1 mg/dL (ref 8.4–10.5)
CO2: 20 meq/L (ref 19–32)
CREATININE: 1.06 mg/dL (ref 0.50–1.35)
Chloride: 98 mEq/L (ref 96–112)
GFR calc Af Amer: 83 mL/min — ABNORMAL LOW (ref >90)
GFR, EST NON AFRICAN AMERICAN: 72 mL/min — AB (ref >90)
Glucose, Bld: 97 mg/dL (ref 70–99)
Potassium: 4.3 mEq/L (ref 3.7–5.3)
Sodium: 136 mEq/L — ABNORMAL LOW (ref 137–147)
Total Bilirubin: 0.8 mg/dL (ref 0.3–1.2)
Total Protein: 7.4 g/dL (ref 6.0–8.3)

## 2014-07-29 LAB — HEPATITIS A ANTIBODY, TOTAL: Hep A Total Ab: NONREACTIVE

## 2014-07-29 LAB — HEPATITIS B SURFACE ANTIBODY,QUALITATIVE: Hep B S Ab: NEGATIVE

## 2014-07-29 MED ORDER — CARVEDILOL 3.125 MG PO TABS: 3.1250 mg | ORAL_TABLET | Freq: Two times a day (BID) | ORAL | Status: DC

## 2014-07-29 MED ORDER — ACETAMINOPHEN 325 MG PO TABS: 650.0000 mg | ORAL_TABLET | ORAL | Status: DC | PRN

## 2014-07-29 MED ORDER — SIMVASTATIN 20 MG PO TABS: 20.0000 mg | ORAL_TABLET | Freq: Every day | ORAL | Status: DC

## 2014-07-29 MED ORDER — FUROSEMIDE 40 MG PO TABS: 40.0000 mg | ORAL_TABLET | Freq: Every day | ORAL | Status: DC

## 2014-07-29 MED ORDER — ASPIRIN 81 MG PO TBEC: 81.0000 mg | DELAYED_RELEASE_TABLET | Freq: Every day | ORAL | Status: DC

## 2014-07-29 MED ORDER — LISINOPRIL 10 MG PO TABS: 10.0000 mg | ORAL_TABLET | Freq: Every day | ORAL | Status: DC

## 2014-07-29 MED ORDER — AMLODIPINE BESYLATE 5 MG PO TABS: 5.0000 mg | ORAL_TABLET | Freq: Every day | ORAL | Status: DC

## 2014-07-29 NOTE — Progress Notes (Signed)
The patient stated that he had been forgetting to use the urinal when asked.  He was re-educated on using the urinal.

## 2014-07-29 NOTE — Discharge Instructions (Signed)
Weigh daily Call [phone] if weight climbs more than 3 pounds in a day or 5 pounds in a week. No salt to very little salt in your diet.  No more than 2000 mg in a day. Call if increased shortness of breath or increased swelling.  Heart healthy 2 gram sodium diet  No strenuous activity until seen in the office.  Call if any questions with meds or problems.  Call [facility] at [phone] if any bleeding, swelling or drainage at cath site.  May shower, no tub baths for 48 hours for groin sticks.  No lifting over 5 pounds for 1 week.  No driving for 1 week.   Stop tobacco, stop alcohol stop any street drugs- these will be toxic to your heart.  The medications should help your heart heal.

## 2014-07-29 NOTE — Discharge Summary (Signed)
Physician Discharge Summary       Patient ID: Jeffery Conrad MRN: [id number] DOB/AGE: February 28, 1951 63 y.o.  Admit date: 07/26/2014 Discharge date: 07/29/2014  Discharge Diagnoses:  Principal Problem:   Acute systolic heart failure Active Problems:   Congestive dilated cardiomyopathy   Acute pulmonary edema   Acute respiratory failure with hypoxia   Accelerated hypertension   NSTEMI (non-ST elevated myocardial infarction)   Transaminitis   Chronic renal insufficiency, stage II (mild)   Dyspnea   CAD (coronary artery disease), native coronary artery, moderate LAD disease, mild to mod RCA disease    NICM (nonischemic cardiomyopathy)   Acute renal failure, resolved   Hyperglycemia- resolved, with hgba1c 5.6   Polysubstance abuse   Tobacco use disorder   Discharged Condition: good  Primary Cardiologist:  Dr. Irish Lack   Procedures: 07/28/14 cardiac cath by Dr. Laurena Bering Course: 63 y.o. male with no significant past medical history who presented to [facility] on 07/26/2014 with complaints of chest pain and shortness of breath. Patient endorsed use of crack cocaine, smoked, in the last 24 hours prior to admit. Also with tobacco abuse. Unaware of any other medical problems but has not been evaluated by healthcare. Reported that approx 12 hours before presentation, he had increasing shortness of breath. In the last 4-5 hours prior to presentation, he became acutely more short of breath and then began to have chest pain, center of his chest, squeezing sensation.  Upon arrival by EMS, was found to be hypoxic requiring bipap. Pressures also in the 190s and a nitro gtt was started.   EKG showed sinus tachy with LBBB and freq PVCs. Chronicity of LBBB unknown, no comparison available. Cxray demonstrated diffuse edema vs. Consolidation. Initial troponin negative. BNP of 7000. WBC of 13. Respiratory and metabolic acidosis: 3.26/71/245 with lactate of 8.7.  Bedside echo  performed by Dr. Elias Else. Very difficult exam due to dyspnea but brief apical 4 and 2 chamber demonstrate significant global hypokinesis, EF 20%.  Pt admitted and diuresed by the next AM he had gone from BiPap to cpap and then nasal cannula.  His NTG was weaned.  Renal function was improving. His troponin was elevated and he underwent cardiac cath on 07/28/14.  On cath EF 20-25%, non obstructive CAD.  Meds were titrated.  By 07/29/14 pt was seen and evaluated by Dr. Debara Pickett and found stable for discharge.  He was seen by dietician for 2 gm Na diet, he was seen by cardiac rehab.  He did not think he could afford meds so are manager will assist obtaining his meds.  Due to cocaine use pt is not candidate for life vest, though this could be revisited if does not use cocaine and is compliant with his med.    LFTs were elevated will need follow up next week , HIV was non reactive, Hepatitis panel with reactive HepAb.  Hepatitis C genotype pending, HCV RNA quant pending at discharge. May need GI consult.   He will be seen in 5-7 days for cardiac follow up.  Recommendations as outpt.  CMP next week and echo in 3 months once meds have been titrated up as an outpatient.      Consults: None  Significant Diagnostic Studies:  BMET    Component Value Date/Time   NA 136* 07/29/2014 0305   K 4.3 07/29/2014 0305   CL 98 07/29/2014 0305   CO2 20 07/29/2014 0305   GLUCOSE 97 07/29/2014 0305   BUN 14 07/29/2014 0305  CREATININE 1.06 07/29/2014 0305   CALCIUM 9.1 07/29/2014 0305   GFRNONAA 72* 07/29/2014 0305   GFRAA 83* 07/29/2014 0305    CBC    Component Value Date/Time   WBC 7.6 07/29/2014 0305   RBC 5.02 07/29/2014 0305   HGB 15.1 07/29/2014 0305   HCT 43.2 07/29/2014 0305   PLT 154 07/29/2014 0305   MCV 86.1 07/29/2014 0305   MCH 30.1 07/29/2014 0305   MCHC 35.0 07/29/2014 0305   RDW 13.0 07/29/2014 0305   LYMPHSABS 7.3* 07/26/2014 0151   MONOABS 0.8 07/26/2014 0151   EOSABS 0.1 07/26/2014  0151   BASOSABS 0.0 07/26/2014 0151    BNP (last 3 results)  Recent Labs  07/26/14 0151  PROBNP 6836.0*   Troponin I : pk of 1.71 decreasing prior to discharge to 1.32  Lipid Panel     Component Value Date/Time   CHOL 127 07/26/2014 0842   TRIG 50 07/26/2014 0842   HDL 43 07/26/2014 0842   CHOLHDL 3.0 07/26/2014 0842   VLDL 10 07/26/2014 0842   LDLCALC 74 07/26/2014 0842    Cardiac Cath: HEMODYNAMICS: Aortic pressure 140/68 mmHg; LV pressure 140/0 mmHg; LVEDP 9 mmHg  ANGIOGRAPHIC DATA: The left main coronary artery is heavily calcified. No obstruction is noted.. The left anterior descending artery is heavily calcified. It wraps around the left ventricular apex. There is a bifurcation lesion in the proximal LAD with the first diagonal that is a [place] 0, 1, 1 (with 0, 50, and 50-70% stenosis).LAD is transapical. No significant obstructions are felt to be present. The left circumflex artery is moderately calcified. A widely patent first obtuse marginal is noted. The continuation of the circumflex is also widely patent. The right coronary artery is patent. It has a double ostium that includes a large acute marginal branch and the right coronary artery proper. The right coronary is difficult to cannulate. The distal right coronary before the PDA contains segmental 50% narrowing.   LEFT VENTRICULOGRAM: Left ventricular angiogram was done in the 30 RAO projection and revealed a dilated left ventricle with reduced systolic function. Left ventricular opacification was by hand injection and was in adequate. We know that the patient's LVEF is 25% by echocardiography. The angiographic images obtained confirm a similar finding.   IMPRESSIONS: 1. Moderate proximal LAD disease bifurcation stenosis. Mild to moderate distal RCA disease. 2. Severe left ventricular dysfunction with EF 20-25% but normal left ventricular end-diastolic pressures. 3. Heavily calcified left  coronary   RECOMMENDATION: Medical therapy. Risk factor modification. Social services assistance..     2 D ECHO: Study Conclusions - Left ventricle: The cavity size was mildly dilated. There was mild concentric hypertrophy. Systolic function was severely reduced. The estimated ejection fraction was in the range of 25% to 30%. Diffuse hypokinesis. Doppler parameters are consistent with abnormal left ventricular relaxation (grade 1 diastolic dysfunction). Doppler parameters are consistent with elevated ventricular end-diastolic filling pressure. - Ventricular septum: Septal motion showed paradox. - Aortic valve: Trileaflet; normal thickness leaflets. There was no regurgitation. - Aortic root: The aortic root was normal in size. - Mitral valve: There was no regurgitation. - Right atrium: The atrium was normal in size. - Pulmonary arteries: Systolic pressure can&'t be assessed. - Inferior vena cava: The vessel was normal in size. - Pericardium, extracardiac: There was no pericardial effusion. Impressions: - Mildly to moderately dilated left ventricle with severe systolic dysfunction and diffuse hypokinesis. Impaited relaxation with mildly elevated filling pressures. No significant valvular abnormality. Normal right ventricular  function. RVSP can&'t be assessed.  PCXR: PORTABLE CHEST - 1 VIEW COMPARISON: None. FINDINGS: Heart size is normal. Pulmonary vascularity appears normal. Diffuse interstitial and airspace disease in the lungs suggesting edema versus diffuse pneumonia. No blunting of visualize costophrenic angles. No pneumothorax. IMPRESSION: Diffuse interstitial and airspace disease in the lungs suggesting edema versus pneumonia.   Discharge Exam: Blood pressure 112/68, pulse 65, temperature 98 F (36.7 C), temperature source Oral, resp. rate 20, height 6' (1.829 m), weight 169 lb 12.1 oz (77 kg), SpO2 98 %.    Disposition:  home      Medication List    TAKE these medications        acetaminophen 325 MG tablet  Commonly known as:  TYLENOL  Take 2 tablets (650 mg total) by mouth every 4 (four) hours as needed for headache or mild pain.     amLODipine 5 MG tablet  Commonly known as:  NORVASC  Take 1 tablet (5 mg total) by mouth daily.     aspirin 81 MG EC tablet  Take 1 tablet (81 mg total) by mouth daily.     carvedilol 3.125 MG tablet  Commonly known as:  COREG  Take 1 tablet (3.125 mg total) by mouth 2 (two) times daily with a meal.     furosemide 40 MG tablet  Commonly known as:  LASIX  Take 1 tablet (40 mg total) by mouth daily.     lisinopril 10 MG tablet  Commonly known as:  PRINIVIL,ZESTRIL  Take 1 tablet (10 mg total) by mouth daily.     simvastatin 20 MG tablet  Commonly known as:  ZOCOR  Take 1 tablet (20 mg total) by mouth daily at 6 PM.       Follow-up Information    Follow up with Murray Hodgkins, NP On 08/05/2014.   Specialty:  Nurse Practitioner   Why:  at 10:00AM for follow up   Contact information:   [address] [place] [address] [phone]       Follow up with Jettie Booze., MD On 09/08/2014.   Specialty:  Interventional Cardiology   Why:  at 4:15pm   Contact information:   [address] [address] [phone]        Discharge Instructions: Weigh daily Call [phone] if weight climbs more than 3 pounds in a day or 5 pounds in a week. No salt to very little salt in your diet.  No more than 2000 mg in a day. Call if increased shortness of breath or increased swelling.  Heart healthy 2 gram sodium diet  No strenuous activity until seen in the office.  Call if any questions with meds or problems.  Call [facility] at [phone] if any bleeding, swelling or drainage at cath site.  May shower, no tub baths for 48 hours for groin sticks.  No lifting over 5 pounds for 1  week.  No driving for 1 week.   Stop tobacco, stop alcohol stop any street drugs- these will be toxic to your heart.  The medications should help your heart heal.     Signed: [facility]: [facility] 07/29/2014, 2:57 PM  Time spent on discharge : > 35 minutes.

## 2014-07-29 NOTE — Progress Notes (Signed)
Discharge instructions completed. Verbalizes understanding. 

## 2014-07-29 NOTE — Telephone Encounter (Signed)
New message     TCM appt on 08-05-14 per Mickel Baas.

## 2014-07-29 NOTE — Progress Notes (Signed)
CARDIAC REHAB PHASE I   PRE:  Rate/Rhythm: 65 SR    BP: sitting 112/68    SaO2: 98 RA  MODE:  Ambulation: 400 ft   POST:  Rate/Rhythm: 70 SR    BP: sitting 130/60     SaO2: 97 RA  Tolerated well, no c/o. Quick pace. Pt is accustomed to walking and riding his bicycle. Discussed daily wts, low sodium, smoking cessation, and ex. Voiced understanding. Sts he does not have any money for meds right now. Notified CM. Pt also needs to get his scales out of storage. Gave HF booklet. [phone]   Josephina Shih [place] CES, ACSM 07/29/2014 3:05 PM

## 2014-07-29 NOTE — Progress Notes (Signed)
Subjective: No complaints  Objective: Vital signs in last 24 hours: Temp:  [97.5 F (36.4 C)-98.5 F (36.9 C)] 97.9 F (36.6 C) (11/06 1058) Pulse Rate:  [50-67] 64 (11/06 1058) Resp:  [14-23] 20 (11/06 1058) BP: (97-138)/(46-94) 125/63 mmHg (11/06 1058) SpO2:  [93 %-99 %] 98 % (11/06 0604) Weight:  [169 lb 5 oz (76.8 kg)-169 lb 12.1 oz (77 kg)] 169 lb 12.1 oz (77 kg) (11/06 0604) Weight change: -4 lb 13.6 oz (-2.2 kg) Last BM Date: 07/28/14 Intake/Output from previous day: -355 since admit -4469 Wt 169 down from 185 11/05 0701 - 11/06 0700 In: 1155 [P.O.:1140] Out: 1525 [Urine:1525] Intake/Output this shift: Total I/O In: 240 [P.O.:240] Out: -   PE: General:Pleasant affect, NAD Skin:Warm and dry, brisk capillary refill HEENT:normocephalic, sclera clear, mucus membranes moist Heart:S1S2 RRR without murmur, gallup, rub or click Lungs:diminished without rales, rhonchi, or wheezes VEH:MCNO, non tender, + BS, do not palpate liver spleen or masses Ext:no lower ext edema, 2+ pedal pulses, 2+ radial pulses Neuro:alert and oriented, MAE, follows commands, + facial symmetry   tele: SR rare PAC rare PVC  Lab Results:  Recent Labs  07/28/14 0402 07/29/14 0305  WBC 8.0 7.6  HGB 15.5 15.1  HCT 45.2 43.2  PLT 134* 154   BMET  Recent Labs  07/28/14 0402 07/29/14 0305  NA 135* 136*  K 4.6 4.3  CL 99 98  CO2 20 20  GLUCOSE 99 97  BUN 13 14  CREATININE 1.03 1.06  CALCIUM 9.1 9.1    Recent Labs  07/26/14 1403 07/26/14 2050  TROPONINI 1.59* 1.32*    Lab Results  Component Value Date   CHOL 127 07/26/2014   HDL 43 07/26/2014   LDLCALC 74 07/26/2014   TRIG 50 07/26/2014   CHOLHDL 3.0 07/26/2014   Lab Results  Component Value Date   HGBA1C 5.6 07/26/2014     No results found for: TSH  Hepatic Function Panel  Recent Labs  07/29/14 0305  PROT 7.4  ALBUMIN 3.2*  AST 53*  ALT 56*  ALKPHOS 66  BILITOT 0.8   No results for input(s):  CHOL in the last 72 hours. No results for input(s): PROTIME in the last 72 hours.     Studies/Results: Cardiac Cath: 1. Moderate proximal LAD disease bifurcation stenosis. Mild to moderate distal RCA disease. 2. Severe left ventricular dysfunction with EF 20-25% but normal left ventricular end-diastolic pressures. 3. Heavily calcified left coronary  Medications: I have reviewed the patient's current medications. Scheduled Meds: . amLODipine  5 mg Oral Daily  . aspirin EC  81 mg Oral Daily  . carvedilol  3.125 mg Oral BID WC  . furosemide  40 mg Oral Daily  . heparin subcutaneous  5,000 Units Subcutaneous 3 times per day  . lisinopril  10 mg Oral Daily  . simvastatin  20 mg Oral q1800   Continuous Infusions:  PRN Meds:.acetaminophen, albuterol, ondansetron (ZOFRAN) IV  Assessment/Plan: Principal Problem:   Acute systolic heart failure diuresed in CCU, now on po lasix and wt is down 16 lbs  Active Problems:   Congestive dilated cardiomyopathy EF 20-25% by cath; by echo 25-30%, on BB, diuretic and ACE    Pulmonary vascular congestion   NSTEMI (non-ST elevated myocardial infarction) pk troponin 1.71, but non obstructive disease   Transaminitis   Chronic renal insufficiency, stage II (mild)   Acute pulmonary edema   Dyspnea   CAD (coronary artery disease),  native coronary artery, moderate LAD disease, mild to mod RCA disease    NICM (nonischemic cardiomyopathy)   LDL 74 on admit, now on statin ? lifevest Will ask dietician and cardiac rehab to see pt  LOS: 3 days   Time spent with pt. :15 minutes. [facility] R  Nurse Practitioner Certified Pager [phone] or after 5pm and on weekends call [phone] 07/29/2014, 11:42 AM

## 2014-07-29 NOTE — Plan of Care (Signed)
Problem: Phase III Progression Outcomes Goal: Vascular site scale level 0 - I Vascular Site Scale Level 0: No bruising/bleeding/hematoma Level I (Mild): Bruising/Ecchymosis, minimal bleeding/ooozing, palpable hematoma < 3 cm Level II (Moderate): Bleeding not affecting hemodynamic parameters, pseudoaneurysm, palpable hematoma > 3 cm Level III (Severe) Bleeding which affects hemodynamic parameters or retroperitoneal hemorrhage  Outcome: Completed/Met Date Met:  07/29/14

## 2014-07-29 NOTE — Progress Notes (Signed)
Nutrition Education Note  RD consulted for nutrition education regarding 2 Gram Sodium diet for CHF.  RD provided "Low Sodium Nutrition Therapy" handout from the Academy of [employer]etics. Reviewed patient's dietary recall. Provided examples on ways to decrease sodium intake in diet. Discouraged intake of processed foods and use of salt shaker. Encouraged fresh fruits and vegetables as well as whole grain sources of carbohydrates to maximize fiber intake.   RD discussed why it is important for patient to adhere to diet recommendations, and emphasized the role of fluids, foods to avoid, and importance of weighing self daily. Teach back method used.  Expect good compliance.  Body mass index is 23.02 kg/(m^2). Pt meets criteria for Normal Weight based on current BMI.  Current diet order is Heart Healthy, patient is consuming approximately 100% of meals at this time. Labs and medications reviewed. No further nutrition interventions warranted at this time. RD contact information provided. If additional nutrition issues arise, please re-consult RD.   Pryor Ochoa RD, LDN Inpatient Clinical Dietitian Pager: [phone] After Hours Pager: [phone]

## 2014-08-01 LAB — HCV RNA QUANT
HCV Quantitative Log: 5.74 {Log} — ABNORMAL HIGH (ref <1.18)
HCV Quantitative: 555221 IU/mL — ABNORMAL HIGH (ref <15)

## 2014-08-01 NOTE — Telephone Encounter (Signed)
lmtcb

## 2014-08-02 NOTE — Telephone Encounter (Signed)
TCM pt;lmtcb

## 2014-08-03 LAB — HEPATITIS C GENOTYPE

## 2014-08-03 NOTE — Telephone Encounter (Signed)
Patient contacted regarding discharge from [facility] on 07/29/2014.  Patient understands to follow up with provider Ignacia Bayley on 08/05/2014 Patient understands discharge instructions? yes Patient understands medications and regiment? States he has not picked up any of his prescribed medications. Is trying to do some part-time work to be able to afford them. Patient understands to bring all medications to this visit? yes

## 2014-08-05 ENCOUNTER — Encounter: Payer: Medicare Other | Admitting: Nurse Practitioner

## 2014-08-09 ENCOUNTER — Encounter: Payer: Self-pay | Admitting: Nurse Practitioner

## 2014-08-09 ENCOUNTER — Ambulatory Visit (INDEPENDENT_AMBULATORY_CARE_PROVIDER_SITE_OTHER): Payer: Medicare Other | Admitting: Nurse Practitioner

## 2014-08-09 VITALS — BP 150/80 | HR 77 | Ht 72.0 in | Wt 180.8 lb

## 2014-08-09 DIAGNOSIS — I429 Cardiomyopathy, unspecified: Secondary | ICD-10-CM

## 2014-08-09 DIAGNOSIS — N182 Chronic kidney disease, stage 2 (mild): Secondary | ICD-10-CM | POA: Diagnosis not present

## 2014-08-09 DIAGNOSIS — I5022 Chronic systolic (congestive) heart failure: Secondary | ICD-10-CM | POA: Diagnosis not present

## 2014-08-09 DIAGNOSIS — Z720 Tobacco use: Secondary | ICD-10-CM

## 2014-08-09 DIAGNOSIS — Z9119 Patient's noncompliance with other medical treatment and regimen: Secondary | ICD-10-CM

## 2014-08-09 DIAGNOSIS — I428 Other cardiomyopathies: Secondary | ICD-10-CM

## 2014-08-09 DIAGNOSIS — Z91199 Patient's noncompliance with other medical treatment and regimen due to unspecified reason: Secondary | ICD-10-CM | POA: Insufficient documentation

## 2014-08-09 NOTE — Patient Instructions (Addendum)
You have been referred to [facility] for DX: Hepatitis C  You have been referred to [place]-- December 1ST @ 9:15 AT [place]   Your physician recommends that you return for lab work in: BMET/ Tuesday, November 24 [place]   Your physician has recommended you make the following change in your medication:   START BYSTOLIC ( 5 MG ) DAILY----- IN PLACE OF COREG  START BENICAR/HCT 20MG -12.5MG  ---- IN PLACE OF LISINOPRIL AND LASIX  WHEN YOU GET MEDICATIONS IN December  --START LISINOPRIL  ( 10 MG ) DAILY --START LASIX ( 40 MG )  TAKE ONE HALF TABLET ( 20 MG ) DAILY ---START COREG ( 3.125 MG ) TWICE A DAY

## 2014-08-09 NOTE — Progress Notes (Signed)
Patient Name: Jeffery Conrad Date of Encounter: 08/09/2014  Primary Care Provider:  No primary care provider on file. Primary Cardiologist:  Pt will f/u in CHF clinic in the future.  Patient Profile  63 year old male with recent diagnosis of nonischemic cardio myopathy in the setting of drug abuse who presents for follow-up.  Problem List   Past Medical History  Diagnosis Date  . COPD (chronic obstructive pulmonary disease)   . Hepatitis C     a. 07/2014 HCV AB reactive, HCV quant log: 5.7, HCV quant [id number].  . CKD (chronic kidney disease), stage II   . Fracture 1976    Bilateral forearm compound fractures  . Non-obstructive CAD     a. 07/2014 Cath: LM nl, LAD nonobs, LCX nl, OM nl, RCA 50d, EF 20-25%  . NICM (nonischemic cardiomyopathy)     a. 07/2014 Echo: EF 25-30%, diff HK, Gr 1 DD.  Marland Kitchen Accelerated hypertension   . Tobacco use disorder   . Chronic systolic CHF (congestive heart failure)     a. 07/2014 Echo: EF 25-30%, diff HK, Gr 1 DD.  . Crack cocaine use   . Noncompliance    Past Surgical History  Procedure Laterality Date  . Fracture surgery    . Tonsillectomy    . Cardiac catheterization  07/28/14    Moderate proximal LAD disease bifurcation stenosis. Mild to moderate distal RCA disease, Heavily calcified left coronary, EF 20-25%    Allergies  No Known Allergies  HPI  62 year old male without prior cardiac history. He was recently admitted with chest pain and dyspnea and ruled in for non-ST elevation MI. Echo showed EF of 25-30% with diffuse hypokinesis. Diagnostic catheterization showed minimal nonobstructive CAD with EF of 20-25%. During hospitalization, he was noted to have significant LFT elevations and hepatitis serologies were sent off. These have returned revealing reactivity for hepatitis C. Though he has a prior history of crack cocaine, tobacco, and alcohol abuse, he says that he has never injected drugs in the past. He was discharged from the  hospital on aspirin, beta blocker, Lasix, ACE inhibitor, statin, and calcium channel blocker therapy. He has not filled any of his prescriptions and said he will not be able to until he receives his monthly check on December 3. He has not been having chest pain and denies PND, orthopnea, dizziness, syncope, edema, or early satiety. He has not been weighing himself at home as he does not currently have a scale but says that a weight of 180, which is today, is in line with what he's been in the past.  Home Medications  Prior to Admission medications   Medication Sig Start Date End Date Taking? Authorizing Provider  aspirin EC 81 MG EC tablet Take 1 tablet (81 mg total) by mouth daily. 07/29/14  Yes Isaiah Serge, NP  acetaminophen (TYLENOL) 325 MG tablet Take 2 tablets (650 mg total) by mouth every 4 (four) hours as needed for headache or mild pain. 07/29/14   Isaiah Serge, NP  carvedilol (COREG) 3.125 MG tablet Take 1 tablet (3.125 mg total) by mouth 2 (two) times daily with a meal. 07/29/14   Isaiah Serge, NP  furosemide (LASIX) 40 MG tablet Take 1 tablet (40 mg total) by mouth daily. 07/29/14   Isaiah Serge, NP  lisinopril (PRINIVIL,ZESTRIL) 10 MG tablet Take 1 tablet (10 mg total) by mouth daily. 07/29/14   Isaiah Serge, NP  simvastatin (ZOCOR) 20 MG tablet Take 1 tablet (20  mg total) by mouth daily at 6 PM. 07/29/14   Isaiah Serge, NP    Review of Systems  He continues to smoke 3-4 cigarettes per day. He has been noncompliant with his prescribed medications. He denies PND, orthopnea, dizziness, syncope, edema, or early satiety.  All other systems reviewed and are otherwise negative except as noted above.  Physical Exam  Blood pressure 150/80, pulse 77, height 6' (1.829 m), weight 180 lb 12.8 oz (82.01 kg), SpO2 97 %.  General: Pleasant, NAD Psych: Normal affect. Neuro: Alert and oriented X 3. Moves all extremities spontaneously. HEENT: Normal  Neck: Supple without bruits or  JVD. Lungs:  Resp regular and unlabored, CTA. Heart: RRR no s3, s4, or murmurs. Abdomen: Soft, non-tender, non-distended, BS + x 4.  Extremities: No clubbing, cyanosis or edema. DP/PT/Radials 2+ and equal bilaterally.  Right wrist catheterization site is without leading, bruit, or hematoma.  Assessment & Plan  1.  Chronic systolic congestive heart failure/nonischemic cardiomyopathy: Patient was recently admitted with chest pain and dyspnea and ruled in for non-ST elevation MI. His discharge weight was 170 pounds. His weight today is 180 pounds fully clothed with shoes and coat on. He has not been having any chest pain, dyspnea, PND, orthopnea, edema, or early satiety. He is also not been taking any of his medications. He will not be able to afford his medications until December 3. I was able to secure for him by bystolic 5 mg daily and also Benicar HCT 20/12.5 mg daily via our samples.  I have given him 3 weeks worth of samples and we have advised that he can switch over to carvedilol 3.125 mg twice a day, lisinopril 10 mg daily, and furosemide once he is able to purchase these medications.  I suspect that he may only require 29 g of Lasix daily.  It's not clear at this point if he will require amlodipine going forward.  I think he will from the resources and follow-up that heart failure clinic can provide and we have made a referral to heart failure clinic for him to follow-up in early December.  2. Tobacco abuse: Patient continues to smoke a few cigarettes per day. I recommended that he identified a day to quit and subsequently do so.  3. Hypertension: His been noncompliant with his medications. Her plan is for him to be on carvedilol and lisinopril as was prescribed at discharge for now, will add by systolic and Benicar HCT. He will have follow-up basic metabolic panel in 1 week.  4. Hyperlipidemia: He will obtain statin once he gets pain.  5. Crack cocaine abuse: He says he has quit.  6.  Hepatitis C: He had elevated LFTs with abnormal hepatitis C serologies. We have made a referral to hepatology through [facility]  7.  CKD II:  F/U BMET next week as he is being started on samples of ARB today.  8.  Noncompliance:  Will be an issue going forward despite Rx affordable meds.  Using samples for now.  9. Disposition: Follow-up in heart failure clinic in approximately 2 weeks as scheduled. We will arrange for follow-up basic metabolic panel for next week.  Murray Hodgkins, NP 08/09/2014, 5:51 PM

## 2014-08-16 ENCOUNTER — Other Ambulatory Visit: Payer: Medicare Other

## 2014-08-23 ENCOUNTER — Encounter (HOSPITAL_COMMUNITY): Payer: Medicare Other

## 2014-08-25 ENCOUNTER — Inpatient Hospital Stay (HOSPITAL_COMMUNITY): Admission: RE | Admit: 2014-08-25 | Payer: Medicare Other | Source: Ambulatory Visit

## 2014-08-31 ENCOUNTER — Encounter (HOSPITAL_COMMUNITY): Payer: Self-pay

## 2014-08-31 ENCOUNTER — Ambulatory Visit (HOSPITAL_COMMUNITY)
Admission: RE | Admit: 2014-08-31 | Discharge: 2014-08-31 | Disposition: A | Discharge status: Home / Self Care | Payer: Medicare Other | Source: Ambulatory Visit | Attending: Internal Medicine | Admitting: Internal Medicine

## 2014-08-31 VITALS — BP 151/78 | HR 77 | Resp 18 | Wt 178.0 lb

## 2014-08-31 DIAGNOSIS — I5022 Chronic systolic (congestive) heart failure: Secondary | ICD-10-CM

## 2014-08-31 DIAGNOSIS — F1099 Alcohol use, unspecified with unspecified alcohol-induced disorder: Secondary | ICD-10-CM | POA: Insufficient documentation

## 2014-08-31 DIAGNOSIS — Z7982 Long term (current) use of aspirin: Secondary | ICD-10-CM | POA: Diagnosis not present

## 2014-08-31 DIAGNOSIS — N182 Chronic kidney disease, stage 2 (mild): Secondary | ICD-10-CM | POA: Diagnosis not present

## 2014-08-31 DIAGNOSIS — F1910 Other psychoactive substance abuse, uncomplicated: Secondary | ICD-10-CM

## 2014-08-31 DIAGNOSIS — Z9119 Patient's noncompliance with other medical treatment and regimen: Secondary | ICD-10-CM

## 2014-08-31 DIAGNOSIS — I129 Hypertensive chronic kidney disease with stage 1 through stage 4 chronic kidney disease, or unspecified chronic kidney disease: Secondary | ICD-10-CM | POA: Diagnosis not present

## 2014-08-31 DIAGNOSIS — Z91199 Patient's noncompliance with other medical treatment and regimen due to unspecified reason: Secondary | ICD-10-CM

## 2014-08-31 DIAGNOSIS — I2510 Atherosclerotic heart disease of native coronary artery without angina pectoris: Secondary | ICD-10-CM | POA: Diagnosis not present

## 2014-08-31 DIAGNOSIS — I429 Cardiomyopathy, unspecified: Secondary | ICD-10-CM

## 2014-08-31 DIAGNOSIS — I10 Essential (primary) hypertension: Secondary | ICD-10-CM | POA: Diagnosis not present

## 2014-08-31 DIAGNOSIS — B1920 Unspecified viral hepatitis C without hepatic coma: Secondary | ICD-10-CM | POA: Diagnosis not present

## 2014-08-31 DIAGNOSIS — F1410 Cocaine abuse, uncomplicated: Secondary | ICD-10-CM | POA: Insufficient documentation

## 2014-08-31 DIAGNOSIS — E785 Hyperlipidemia, unspecified: Secondary | ICD-10-CM | POA: Insufficient documentation

## 2014-08-31 DIAGNOSIS — Z79899 Other long term (current) drug therapy: Secondary | ICD-10-CM | POA: Insufficient documentation

## 2014-08-31 DIAGNOSIS — I428 Other cardiomyopathies: Secondary | ICD-10-CM

## 2014-08-31 DIAGNOSIS — Z720 Tobacco use: Secondary | ICD-10-CM

## 2014-08-31 DIAGNOSIS — F17200 Nicotine dependence, unspecified, uncomplicated: Secondary | ICD-10-CM

## 2014-08-31 LAB — BASIC METABOLIC PANEL
ANION GAP: 11 (ref 5–15)
BUN: 14 mg/dL (ref 6–23)
CALCIUM: 9.2 mg/dL (ref 8.4–10.5)
CO2: 25 mEq/L (ref 19–32)
Chloride: 97 mEq/L (ref 96–112)
Creatinine, Ser: 1.09 mg/dL (ref 0.50–1.35)
GFR calc Af Amer: 80 mL/min — ABNORMAL LOW (ref >90)
GFR, EST NON AFRICAN AMERICAN: 69 mL/min — AB (ref >90)
Glucose, Bld: 86 mg/dL (ref 70–99)
Potassium: 4.3 mEq/L (ref 3.7–5.3)
SODIUM: 133 meq/L — AB (ref 137–147)

## 2014-08-31 MED ORDER — FUROSEMIDE 20 MG PO TABS
20.0000 mg | ORAL_TABLET | Freq: Every day | ORAL | Status: DC | PRN
Start: 2014-08-31 — End: 2014-09-30

## 2014-08-31 MED ORDER — LISINOPRIL 10 MG PO TABS: 10.0000 mg | ORAL_TABLET | Freq: Every day | ORAL | Status: DC

## 2014-08-31 MED ORDER — CARVEDILOL 3.125 MG PO TABS: 3.1250 mg | ORAL_TABLET | Freq: Two times a day (BID) | ORAL | Status: DC

## 2014-08-31 MED ORDER — ATORVASTATIN CALCIUM 20 MG PO TABS: 20.0000 mg | ORAL_TABLET | Freq: Every day | ORAL | Status: DC

## 2014-08-31 NOTE — Progress Notes (Signed)
Patient ID: JESSEN SIEGMAN, male   DOB: [birth year], 63 y.o.   MRN: [id number]  Primary Cardiologist: Dr Scarlette Calico PCP: None   HPI: 63 year old male with recent diagnosis of nonischemic cardio myopathy in the setting of drug abuse . He also has a history of hep c, HTN, and smoker.   Admitted 07/26/14 with increased dyspnea and chest pain. Had cath as noted below with recommendations for medical management. Started on HF meds and discharged however he was unable pay for them so he did not start.   He is referred to the HF clinic by Ignacia Bayley NP. He was seen by Ignacia Bayley 08/09/14 and he was provided with 3 weeks of HF meds but he has since run out. Today he denies SOB/PNF/Orthopnea. Unable to weigh as he does not have a scale. Currently unemployed and rides his bike to appointments. Uses cocaine at least weekly. Smokes cigarettes daily. Drinks a beer or two daily. Says he is interested in quitting drugs.   Test EKG NSR 65 BPM QRS 118   LHC 07/27/14  Moderate proximal LAD disease bifurcation stenosis. Mild to moderate distal RCA disease. Heavily calcified left coronary  ECHO 07/2014 EF 20-25%   SH: Current cocaine abuses, unemployed, lives with girl friend, has difficulty with transportation FH: No know CAD in his family  ROS: All systems negative except as listed in HPI, PMH and Problem List.  Past Medical History  Diagnosis Date  . COPD (chronic obstructive pulmonary disease)   . Hepatitis C     a. 07/2014 HCV AB reactive, HCV quant log: 5.7, HCV quant [id number].  . CKD (chronic kidney disease), stage II   . Fracture 1976    Bilateral forearm compound fractures  . Non-obstructive CAD     a. 07/2014 Cath: LM nl, LAD nonobs, LCX nl, OM nl, RCA 50d, EF 20-25%  . NICM (nonischemic cardiomyopathy)     a. 07/2014 Echo: EF 25-30%, diff HK, Gr 1 DD.  Marland Kitchen Accelerated hypertension   . Tobacco use disorder   . Chronic systolic CHF (congestive heart failure)     a. 07/2014 Echo: EF 25-30%,  diff HK, Gr 1 DD.  . Crack cocaine use   . Noncompliance     Current Outpatient Prescriptions  Medication Sig Dispense Refill  . acetaminophen (TYLENOL) 325 MG tablet Take 2 tablets (650 mg total) by mouth every 4 (four) hours as needed for headache or mild pain.    Marland Kitchen aspirin EC 81 MG EC tablet Take 1 tablet (81 mg total) by mouth daily.    . carvedilol (COREG) 3.125 MG tablet Take 1 tablet (3.125 mg total) by mouth 2 (two) times daily with a meal. 60 tablet 6  . lisinopril (PRINIVIL,ZESTRIL) 10 MG tablet Take 1 tablet (10 mg total) by mouth daily. 30 tablet 6  . simvastatin (ZOCOR) 20 MG tablet Take 1 tablet (20 mg total) by mouth daily at 6 PM. 30 tablet 6  . furosemide (LASIX) 40 MG tablet Take 1 tablet (40 mg total) by mouth daily. (Patient not taking: Reported on 08/31/2014) 30 tablet 6   No current facility-administered medications for this encounter.     PHYSICAL EXAM: Filed Vitals:   08/31/14 1422  BP: 151/78  Pulse: 77  Resp: 18  Weight: 178 lb (80.74 kg)  SpO2: 99%    General:  Well appearing. No resp difficulty ambulated in the clinic without difficulty.  HEENT: normal Neck: supple. JVP flat. Carotids 2+ bilaterally; no bruits. No lymphadenopathy  or thryomegaly appreciated. Cor: PMI normal. Regular rate & rhythm. No rubs, gallops or murmurs. No S3  Lungs: clear Abdomen: soft, nontender, nondistended. No hepatosplenomegaly. No bruits or masses. Good bowel sounds. Extremities: no cyanosis, clubbing, rash, edema Neuro: alert & orientedx3, cranial nerves grossly intact. Moves all 4 extremities w/o difficulty. Affect pleasant.     ASSESSMENT & PLAN:  1. Chronic Systolic Heart Failure, NICM 07/2014. Nonobstructive LHC in November 2015. NICM NYHA II.  He presents today off all medications except aspirin due to cost.  Volume status stable. Add 40 mg po lasix as needed for 3 pound weight gain in 24 hours.  - Add 3.125 mg carvedilol twice a day - Add 10 mg lisinopril  daily.  Hold off on spironolactone until he returns to at least 3 appointments.  Plan to repeat in 3-4 months after HF meds optimized. He is aware.  Extensive amount of time spent discussing HF to include: daily weights, low salt food choices, limiting fluid intake to < 2 liters per day.  Provided with 30 day supply of HF meds from HF fund.  Provided with scale, weight chart,  And pill box.  Referred to HF Navigator and HF SW.  2. Drug Abuse- Current cocaine and alcohol abuse- I have stressed that he needs to stop and he is interested in stopping however he has been using > 20 years. Referred to HF SW for substance abuse.  3. Hep C - Elevated AST/ALT11/2015 HCV AB reactive, HCV quant log: 5.7, HCV quant [id number]. Refer to [facility].  4. HTN- Elevated As above start HF meds. 5.  Hyperlipidemia- Start atorvastatin.  6. Nonobstructive CAD- NSTEMI 07/2014 . Had cath as above.  Continue baby asprin and as above starting atorvastatin.   Follow up in 2 weeks for additional medication titration. Check BMET and EKG next visit.   [name],[name]  4:14 PM

## 2014-08-31 NOTE — Patient Instructions (Signed)
START... 1.  Carvedilol 3.125mg  tablet twice daily. 2.  Lisinopril 10mg  tablet once daily. 3.  Atorvastatin 20mg  tablet once daily. 4.  Lasix 20mg  tablet AS NEEDED for weight gain 3 lbs or more overnight.  (Call us if this becomes a frequent occurrence)  Follow up 3 weeks.  Merry Christmas!  Do the following things EVERYDAY: 1) Weigh yourself in the morning before breakfast. Write it down and keep it in a log. 2) Take your medicines as prescribed 3) Eat low salt foods-Limit salt (sodium) to 2000 mg per day.  4) Stay as active as you can everyday 5) Limit all fluids for the day to less than 2 liters

## 2014-09-01 ENCOUNTER — Encounter (HOSPITAL_COMMUNITY): Payer: Self-pay | Admitting: Interventional Cardiology

## 2014-09-08 ENCOUNTER — Encounter: Payer: Medicare Other | Admitting: Interventional Cardiology

## 2014-09-20 ENCOUNTER — Encounter (HOSPITAL_COMMUNITY): Payer: Medicare Other

## 2014-09-21 ENCOUNTER — Telehealth: Payer: Self-pay | Admitting: Licensed Clinical Social Worker

## 2014-09-21 NOTE — Telephone Encounter (Signed)
CSW referred to assist with community resources. Patient's appointment in the HF clinic was cancelled for 09/20/14. CSW attempted to contact via phone with no success. CSW left message for return call. Will await return call from patient. Raquel Sarna, [place]

## 2014-09-30 ENCOUNTER — Ambulatory Visit (HOSPITAL_COMMUNITY)
Admission: RE | Admit: 2014-09-30 | Discharge: 2014-09-30 | Disposition: A | Discharge status: Home / Self Care | Payer: Medicare Other | Source: Ambulatory Visit | Attending: Internal Medicine | Admitting: Internal Medicine

## 2014-09-30 ENCOUNTER — Encounter (HOSPITAL_COMMUNITY): Payer: Self-pay

## 2014-09-30 VITALS — BP 116/69 | HR 61 | Resp 18 | Wt 174.2 lb

## 2014-09-30 DIAGNOSIS — I2510 Atherosclerotic heart disease of native coronary artery without angina pectoris: Secondary | ICD-10-CM | POA: Diagnosis not present

## 2014-09-30 DIAGNOSIS — F1410 Cocaine abuse, uncomplicated: Secondary | ICD-10-CM | POA: Insufficient documentation

## 2014-09-30 DIAGNOSIS — B1920 Unspecified viral hepatitis C without hepatic coma: Secondary | ICD-10-CM | POA: Insufficient documentation

## 2014-09-30 DIAGNOSIS — I10 Essential (primary) hypertension: Secondary | ICD-10-CM | POA: Diagnosis not present

## 2014-09-30 DIAGNOSIS — Z9119 Patient's noncompliance with other medical treatment and regimen: Secondary | ICD-10-CM

## 2014-09-30 DIAGNOSIS — E785 Hyperlipidemia, unspecified: Secondary | ICD-10-CM | POA: Insufficient documentation

## 2014-09-30 DIAGNOSIS — F17200 Nicotine dependence, unspecified, uncomplicated: Secondary | ICD-10-CM

## 2014-09-30 DIAGNOSIS — I5022 Chronic systolic (congestive) heart failure: Secondary | ICD-10-CM

## 2014-09-30 DIAGNOSIS — N182 Chronic kidney disease, stage 2 (mild): Secondary | ICD-10-CM

## 2014-09-30 DIAGNOSIS — Z720 Tobacco use: Secondary | ICD-10-CM

## 2014-09-30 DIAGNOSIS — F1010 Alcohol abuse, uncomplicated: Secondary | ICD-10-CM | POA: Insufficient documentation

## 2014-09-30 DIAGNOSIS — F1910 Other psychoactive substance abuse, uncomplicated: Secondary | ICD-10-CM

## 2014-09-30 DIAGNOSIS — Z91199 Patient's noncompliance with other medical treatment and regimen due to unspecified reason: Secondary | ICD-10-CM

## 2014-09-30 LAB — BASIC METABOLIC PANEL
Anion gap: 5 (ref 5–15)
BUN: 13 mg/dL (ref 6–23)
CHLORIDE: 105 meq/L (ref 96–112)
CO2: 27 mmol/L (ref 19–32)
CREATININE: 1.19 mg/dL (ref 0.50–1.35)
Calcium: 9.2 mg/dL (ref 8.4–10.5)
GFR calc Af Amer: 72 mL/min — ABNORMAL LOW (ref >90)
GFR calc non Af Amer: 62 mL/min — ABNORMAL LOW (ref >90)
Glucose, Bld: 93 mg/dL (ref 70–99)
POTASSIUM: 4.8 mmol/L (ref 3.5–5.1)
Sodium: 137 mmol/L (ref 135–145)

## 2014-09-30 MED ORDER — CARVEDILOL 3.125 MG PO TABS: 3.1250 mg | ORAL_TABLET | Freq: Two times a day (BID) | ORAL | Status: DC

## 2014-09-30 MED ORDER — FUROSEMIDE 20 MG PO TABS: 20.0000 mg | ORAL_TABLET | Freq: Every day | ORAL | Status: DC | PRN

## 2014-09-30 MED ORDER — ATORVASTATIN CALCIUM 20 MG PO TABS: 20.0000 mg | ORAL_TABLET | Freq: Every day | ORAL | Status: DC

## 2014-09-30 MED ORDER — LISINOPRIL 5 MG PO TABS: ORAL_TABLET | ORAL | Status: DC

## 2014-09-30 NOTE — Patient Instructions (Signed)
Follow up in 4 weeks   Take lisinopril 10 mg in am and 5 mg in pm   Do the following things EVERYDAY: 1) Weigh yourself in the morning before breakfast. Write it down and keep it in a log. 2) Take your medicines as prescribed 3) Eat low salt foods-Limit salt (sodium) to 2000 mg per day.  4) Stay as active as you can everyday 5) Limit all fluids for the day to less than 2 liters

## 2014-09-30 NOTE — Progress Notes (Signed)
Patient ID: Jeffery Conrad, male   DOB: January 16, 1951, 64 y.o.   MRN: [id number]  Primary Cardiologist: Dr Scarlette Calico PCP: None   HPI: 64 year old male with recent diagnosis of nonischemic cardio myopathy in the setting of drug abuse . He also has a history of hep c, HTN, and smoker.   Admitted 07/26/14 with increased dyspnea and chest pain. Had cath as noted below with recommendations for medical management. Started on HF meds and discharged however he was unable pay for them so he did not start.   He returns for follow up. Last visit 10 mg lisinopril added as well as carvedilol 3.125 mg twice a day.  Denies SOB/PND/Orthopnea/CP. Weight at home 173-176 pounds. Smoking 1 pack every 2 days. Drinking a beer a day. Uses cocaine once a week. Riding his bike. Does not have a car.      LHC 07/27/14  Moderate proximal LAD disease bifurcation stenosis. Mild to moderate distal RCA disease. Heavily calcified left coronary  ECHO 07/2014 EF 20-25%   Labs 08/31/14 k 4.3 Creatinine 1.09  SH: Current cocaine abuses, unemployed, lives with girl friend, has difficulty with transportation FH: No know CAD in his family  ROS: All systems negative except as listed in HPI, PMH and Problem List.  Past Medical History  Diagnosis Date  . COPD (chronic obstructive pulmonary disease)   . Hepatitis C     a. 07/2014 HCV AB reactive, HCV quant log: 5.7, HCV quant [id number].  . CKD (chronic kidney disease), stage II   . Fracture 1976    Bilateral forearm compound fractures  . Non-obstructive CAD     a. 07/2014 Cath: LM nl, LAD nonobs, LCX nl, OM nl, RCA 50d, EF 20-25%  . NICM (nonischemic cardiomyopathy)     a. 07/2014 Echo: EF 25-30%, diff HK, Gr 1 DD.  Marland Kitchen Accelerated hypertension   . Tobacco use disorder   . Chronic systolic CHF (congestive heart failure)     a. 07/2014 Echo: EF 25-30%, diff HK, Gr 1 DD.  . Crack cocaine use   . Noncompliance     Current Outpatient Prescriptions  Medication Sig Dispense  Refill  . aspirin EC 81 MG EC tablet Take 1 tablet (81 mg total) by mouth daily.    Marland Kitchen atorvastatin (LIPITOR) 20 MG tablet Take 1 tablet (20 mg total) by mouth daily. 90 tablet 3  . carvedilol (COREG) 3.125 MG tablet Take 1 tablet (3.125 mg total) by mouth 2 (two) times daily. 180 tablet 3  . DM-Phenylephrine-Acetaminophen (TYLENOL COLD MULTI-SYMPTOM) 10-5-325 MG/15ML LIQD Take 10 mLs by mouth 2 (two) times daily.    . furosemide (LASIX) 20 MG tablet Take 1 tablet (20 mg total) by mouth daily as needed. For weight gain 3 lbs or more overnight. 90 tablet 3  . lisinopril (PRINIVIL,ZESTRIL) 10 MG tablet Take 1 tablet (10 mg total) by mouth daily. 90 tablet 3   No current facility-administered medications for this encounter.     PHYSICAL EXAM: Filed Vitals:   09/30/14 0920  BP: 116/69  Pulse: 61  Resp: 18  Weight: 174 lb 4 oz (79.039 kg)  SpO2: 99%    General:  Well appearing. No resp difficulty ambulated in the clinic without difficulty.  HEENT: normal Neck: supple. JVP flat. Carotids 2+ bilaterally; no bruits. No lymphadenopathy or thryomegaly appreciated. Cor: PMI normal. Regular rate & rhythm. No rubs, gallops or murmurs. No S3  Lungs: clear Abdomen: soft, nontender, nondistended. No hepatosplenomegaly. No bruits or masses. Good bowel  sounds. Extremities: no cyanosis, clubbing, rash, edema Neuro: alert & orientedx3, cranial nerves grossly intact. Moves all 4 extremities w/o difficulty. Affect pleasant.     ASSESSMENT & PLAN:  1. Chronic Systolic Heart Failure, NICM 07/2014. Nonobstructive LHC in November 2015. NICM. NYHA II.   Volume status stable. Add 40 mg po lasix as needed for 3 pound weight gain in 24 hours.  - Continue 3.125 mg carvedilol twice a day - Continue 10 mg lisinopril in am and add 5 mg in pm.   Hold off on spironolactone until he returns to at least 1 more appointment.  Plan to repeat in 3-4 months after HF meds optimized.  Reinforced daily weights, low salt  food choices, limiting fluid intake to < 2 liters per day.  Referred to paramedicine.  Provided with 30 day supply of HF meds from HF fund.  2. Drug Abuse- Current cocaine and alcohol abuse- I have stressed that he needs to stop and he is interested in stopping however he has been using > 20 years. Needs substance abuse. HF SW trying to follow up.  3. Hep C - Elevated AST/ALT11/2015 HCV AB reactive, HCV quant log: 5.7, HCV quant [id number]. Refer to [facility].  4. HTN- Much improved on medications. Increase lisinopril as above.  5.  Hyperlipidemia- Continue  atorvastatin.  6. Nonobstructive CAD- NSTEMI 07/2014 . Had cath as above.  Continue baby asprin and atorvastatin.   Follow up in 4 weeks for additional medication titration. Check BMET today. K 4.8 Creatinine 1.19 .   Plan to repeat BMET in 10 days.   [name],[name]  9:28 AM

## 2014-11-02 ENCOUNTER — Ambulatory Visit (HOSPITAL_COMMUNITY)
Admission: RE | Admit: 2014-11-02 | Discharge: 2014-11-02 | Disposition: A | Discharge status: Home / Self Care | Payer: Medicare Other | Source: Ambulatory Visit | Attending: Adult Health | Admitting: Adult Health

## 2014-11-02 ENCOUNTER — Encounter (HOSPITAL_COMMUNITY): Payer: Self-pay

## 2014-11-02 VITALS — BP 122/68 | HR 72 | Resp 18 | Wt 182.2 lb

## 2014-11-02 DIAGNOSIS — Z560 Unemployment, unspecified: Secondary | ICD-10-CM | POA: Insufficient documentation

## 2014-11-02 DIAGNOSIS — I429 Cardiomyopathy, unspecified: Secondary | ICD-10-CM | POA: Diagnosis not present

## 2014-11-02 DIAGNOSIS — F1010 Alcohol abuse, uncomplicated: Secondary | ICD-10-CM | POA: Insufficient documentation

## 2014-11-02 DIAGNOSIS — E785 Hyperlipidemia, unspecified: Secondary | ICD-10-CM | POA: Insufficient documentation

## 2014-11-02 DIAGNOSIS — Z7982 Long term (current) use of aspirin: Secondary | ICD-10-CM | POA: Insufficient documentation

## 2014-11-02 DIAGNOSIS — I5020 Unspecified systolic (congestive) heart failure: Secondary | ICD-10-CM | POA: Insufficient documentation

## 2014-11-02 DIAGNOSIS — N189 Chronic kidney disease, unspecified: Secondary | ICD-10-CM | POA: Insufficient documentation

## 2014-11-02 DIAGNOSIS — B1920 Unspecified viral hepatitis C without hepatic coma: Secondary | ICD-10-CM | POA: Diagnosis not present

## 2014-11-02 DIAGNOSIS — F17210 Nicotine dependence, cigarettes, uncomplicated: Secondary | ICD-10-CM | POA: Diagnosis not present

## 2014-11-02 DIAGNOSIS — I159 Secondary hypertension, unspecified: Secondary | ICD-10-CM

## 2014-11-02 DIAGNOSIS — F1910 Other psychoactive substance abuse, uncomplicated: Secondary | ICD-10-CM

## 2014-11-02 DIAGNOSIS — I252 Old myocardial infarction: Secondary | ICD-10-CM | POA: Diagnosis not present

## 2014-11-02 DIAGNOSIS — F1410 Cocaine abuse, uncomplicated: Secondary | ICD-10-CM | POA: Insufficient documentation

## 2014-11-02 DIAGNOSIS — I129 Hypertensive chronic kidney disease with stage 1 through stage 4 chronic kidney disease, or unspecified chronic kidney disease: Secondary | ICD-10-CM | POA: Diagnosis not present

## 2014-11-02 DIAGNOSIS — F17200 Nicotine dependence, unspecified, uncomplicated: Secondary | ICD-10-CM

## 2014-11-02 DIAGNOSIS — I2510 Atherosclerotic heart disease of native coronary artery without angina pectoris: Secondary | ICD-10-CM | POA: Insufficient documentation

## 2014-11-02 DIAGNOSIS — Z720 Tobacco use: Secondary | ICD-10-CM

## 2014-11-02 DIAGNOSIS — I5022 Chronic systolic (congestive) heart failure: Secondary | ICD-10-CM

## 2014-11-02 MED ORDER — ASPIRIN 81 MG PO TBEC: 81.0000 mg | DELAYED_RELEASE_TABLET | Freq: Every day | ORAL | Status: DC

## 2014-11-02 MED ORDER — CARVEDILOL 3.125 MG PO TABS: 3.1250 mg | ORAL_TABLET | Freq: Two times a day (BID) | ORAL | Status: DC

## 2014-11-02 MED ORDER — ATORVASTATIN CALCIUM 20 MG PO TABS: 20.0000 mg | ORAL_TABLET | Freq: Every day | ORAL | Status: DC

## 2014-11-02 MED ORDER — LOSARTAN POTASSIUM 50 MG PO TABS
50.0000 mg | ORAL_TABLET | Freq: Every day | ORAL | Status: DC
Start: 2014-11-02 — End: 2014-11-30

## 2014-11-02 MED ORDER — SPIRONOLACTONE 25 MG PO TABS: 12.5000 mg | ORAL_TABLET | Freq: Every day | ORAL | Status: DC

## 2014-11-02 MED ORDER — LISINOPRIL 5 MG PO TABS: ORAL_TABLET | ORAL | Status: DC

## 2014-11-02 NOTE — Progress Notes (Signed)
Patient ID: Jeffery Conrad, male   DOB: 01-11-1951, 64 y.o.   MRN: [id number]  Primary Cardiologist: Dr Scarlette Calico PCP: None   HPI: 64 year old male with recent diagnosis of nonischemic cardio myopathy in the setting of drug abuse . He also has a history of hep c, HTN, and smoker.   Admitted 07/26/14 with increased dyspnea and chest pain. Had cath as noted below with recommendations for medical management. Started on HF meds and discharged however he was unable pay for them so he did not start.   He returns for follow up. Last visit he continued on 10 mg lisinopril  In am and added 5 mg in pm. Having dry cough. Denies SOB/PND/Orthopnea/CP. Weight at home 173-176 pounds.Took lasix 3 times this month.  Smoking 1 pack every 2 days. Drinking a beer a day. Has not had cocaine in the last 2 weeks. Riding his bike to appointments. Does not have a car. Can not pay for  medications.       LHC 07/27/14  Moderate proximal LAD disease bifurcation stenosis. Mild to moderate distal RCA disease. Heavily calcified left coronary  ECHO 07/2014 EF 20-25%   Labs 08/31/14 k 4.3 Creatinine 1.09 Labs 09/30/2014 K 4.8 Creatinine 1.19   SH: Current cocaine abuses, unemployed, lives with girl friend, has difficulty with transportation FH: No know CAD in his family  ROS: All systems negative except as listed in HPI, PMH and Problem List.  Past Medical History  Diagnosis Date  . COPD (chronic obstructive pulmonary disease)   . Hepatitis C     a. 07/2014 HCV AB reactive, HCV quant log: 5.7, HCV quant [id number].  . CKD (chronic kidney disease), stage II   . Fracture 1976    Bilateral forearm compound fractures  . Non-obstructive CAD     a. 07/2014 Cath: LM nl, LAD nonobs, LCX nl, OM nl, RCA 50d, EF 20-25%  . NICM (nonischemic cardiomyopathy)     a. 07/2014 Echo: EF 25-30%, diff HK, Gr 1 DD.  Marland Kitchen Accelerated hypertension   . Tobacco use disorder   . Chronic systolic CHF (congestive heart failure)     a. 07/2014  Echo: EF 25-30%, diff HK, Gr 1 DD.  . Crack cocaine use   . Noncompliance     Current Outpatient Prescriptions  Medication Sig Dispense Refill  . aspirin EC 81 MG EC tablet Take 1 tablet (81 mg total) by mouth daily.    Marland Kitchen atorvastatin (LIPITOR) 20 MG tablet Take 1 tablet (20 mg total) by mouth daily. 30 tablet 2  . carvedilol (COREG) 3.125 MG tablet Take 1 tablet (3.125 mg total) by mouth 2 (two) times daily. 60 tablet 2  . DM-Phenylephrine-Acetaminophen (TYLENOL COLD MULTI-SYMPTOM) 10-5-325 MG/15ML LIQD Take 10 mLs by mouth 2 (two) times daily.    . furosemide (LASIX) 20 MG tablet Take 1 tablet (20 mg total) by mouth daily as needed. For weight gain 3 lbs or more overnight. 90 tablet 3  . lisinopril (PRINIVIL,ZESTRIL) 5 MG tablet Take 2 tabs (10mg ) in am and 1 tab (5mg ) in pm 90 tablet 2   No current facility-administered medications for this encounter.     PHYSICAL EXAM: Filed Vitals:   11/02/14 1425  BP: 122/68  Pulse: 72  Resp: 18  Weight: 182 lb 4 oz (82.668 kg)  SpO2: 98%    General:  Well appearing. No resp difficulty ambulated in the clinic without difficulty.  HEENT: normal Neck: supple. JVP flat. Carotids 2+ bilaterally; no bruits. No  lymphadenopathy or thryomegaly appreciated. Cor: PMI normal. Regular rate & rhythm. No rubs, gallops or murmurs. No S3  Lungs: clear Abdomen: soft, nontender, nondistended. No hepatosplenomegaly. No bruits or masses. Good bowel sounds. Extremities: no cyanosis, clubbing, rash, edema Neuro: alert & orientedx3, cranial nerves grossly intact. Moves all 4 extremities w/o difficulty. Affect pleasant.     ASSESSMENT & PLAN:  1. Chronic Systolic Heart Failure, NICM 07/2014. Nonobstructive LHC in November 2015. NICM. NYHA II.   Volume status stable. Continue 40 mg po lasix as needed for 3 pound weight gain in 24 hours.  - Continue 3.125 mg carvedilol twice a day - Stop Ace due to dry cough. Add losartan 50 mg daily - Add 12.5 mg  spironolactone daily as he has been taking meds and following up in the clinic.  - Plan to repeat ECHO.  Reinforced daily weights, low salt food choices, limiting fluid intake to < 2 liters per day.  2. Drug Abuse- Current cocaine and alcohol abuse- I have stressed that he needs to stop and he is interested in stopping however he has been using > 20 years. Needs substance abuse counseling. HF SW trying to follow up.  3. Hep C - Elevated AST/ALT11/2015 HCV AB reactive, HCV quant log: 5.7, HCV quant [id number]. Refer to [facility] but he has not heard from [facility]. Will follow up.  4. HTN- Stable. Continue current regimen 5.  Hyperlipidemia- Continue  atorvastatin.  6. Nonobstructive CAD- NSTEMI 07/2014 . Had cath as above.  Continue baby asprin and atorvastatin.  7. Current Tobacco Abuse. - Encouraged to stop smoking.   Follow up in 1 week for BMET and in 4 weeks for an ECHO.     Xayne Brumbaugh NP-C  1:58 PM

## 2014-11-02 NOTE — Patient Instructions (Addendum)
STOP Lisinopril START Losartan 50 mg daily  Labs needed in one week (BMET)  Your physician recommends that you schedule a follow-up appointment in: 4 weeks with a echocardiogram  Your physician has requested that you have an echocardiogram. Echocardiography is a painless test that uses sound waves to create images of your heart. It provides your doctor with information about the size and shape of your heart and how well your heart's chambers and valves are working. This procedure takes approximately one hour. There are no restrictions for this procedure.  Do the following things EVERYDAY: 1) Weigh yourself in the morning before breakfast. Write it down and keep it in a log. 2) Take your medicines as prescribed 3) Eat low salt foods-Limit salt (sodium) to 2000 mg per day.  4) Stay as active as you can everyday 5) Limit all fluids for the day to less than 2 liters 6)

## 2014-11-09 ENCOUNTER — Ambulatory Visit (HOSPITAL_COMMUNITY)
Admission: RE | Admit: 2014-11-09 | Discharge: 2014-11-09 | Disposition: A | Discharge status: Home / Self Care | Payer: Medicare Other | Source: Ambulatory Visit | Attending: Adult Health | Admitting: Adult Health

## 2014-11-09 DIAGNOSIS — I5022 Chronic systolic (congestive) heart failure: Secondary | ICD-10-CM | POA: Diagnosis not present

## 2014-11-09 LAB — BASIC METABOLIC PANEL
Anion gap: 4 — ABNORMAL LOW (ref 5–15)
BUN: 14 mg/dL (ref 6–23)
CALCIUM: 8.9 mg/dL (ref 8.4–10.5)
CO2: 26 mmol/L (ref 19–32)
Chloride: 105 mmol/L (ref 96–112)
Creatinine, Ser: 1.14 mg/dL (ref 0.50–1.35)
GFR, EST AFRICAN AMERICAN: 76 mL/min — AB (ref >90)
GFR, EST NON AFRICAN AMERICAN: 65 mL/min — AB (ref >90)
GLUCOSE: 116 mg/dL — AB (ref 70–99)
POTASSIUM: 4.3 mmol/L (ref 3.5–5.1)
Sodium: 135 mmol/L (ref 135–145)

## 2014-11-23 ENCOUNTER — Other Ambulatory Visit (HOSPITAL_COMMUNITY): Payer: Self-pay | Admitting: Cardiology

## 2014-11-23 DIAGNOSIS — I5022 Chronic systolic (congestive) heart failure: Secondary | ICD-10-CM

## 2014-11-30 ENCOUNTER — Ambulatory Visit (HOSPITAL_COMMUNITY)
Admission: RE | Admit: 2014-11-30 | Discharge: 2014-11-30 | Disposition: A | Discharge status: Home / Self Care | Payer: Medicare Other | Source: Ambulatory Visit | Attending: Internal Medicine | Admitting: Internal Medicine

## 2014-11-30 ENCOUNTER — Ambulatory Visit (HOSPITAL_BASED_OUTPATIENT_CLINIC_OR_DEPARTMENT_OTHER)
Admission: RE | Admit: 2014-11-30 | Discharge: 2014-11-30 | Disposition: A | Discharge status: Home / Self Care | Payer: Medicare Other | Source: Ambulatory Visit | Attending: Internal Medicine | Admitting: Internal Medicine

## 2014-11-30 ENCOUNTER — Encounter (HOSPITAL_COMMUNITY): Payer: Self-pay

## 2014-11-30 VITALS — BP 109/70 | HR 52 | Wt 182.8 lb

## 2014-11-30 DIAGNOSIS — I5022 Chronic systolic (congestive) heart failure: Secondary | ICD-10-CM

## 2014-11-30 DIAGNOSIS — Z720 Tobacco use: Secondary | ICD-10-CM | POA: Diagnosis not present

## 2014-11-30 DIAGNOSIS — I509 Heart failure, unspecified: Secondary | ICD-10-CM | POA: Diagnosis not present

## 2014-11-30 DIAGNOSIS — F17200 Nicotine dependence, unspecified, uncomplicated: Secondary | ICD-10-CM

## 2014-11-30 MED ORDER — ATORVASTATIN CALCIUM 20 MG PO TABS: 20.0000 mg | ORAL_TABLET | Freq: Every day | ORAL | Status: DC

## 2014-11-30 MED ORDER — LOSARTAN POTASSIUM 50 MG PO TABS: 50.0000 mg | ORAL_TABLET | Freq: Every day | ORAL | Status: DC

## 2014-11-30 MED ORDER — SPIRONOLACTONE 25 MG PO TABS: 12.5000 mg | ORAL_TABLET | Freq: Every day | ORAL | Status: DC

## 2014-11-30 MED ORDER — FUROSEMIDE 20 MG PO TABS: 20.0000 mg | ORAL_TABLET | Freq: Every day | ORAL | Status: DC | PRN

## 2014-11-30 MED ORDER — ASPIRIN 81 MG PO TBEC: 81.0000 mg | DELAYED_RELEASE_TABLET | Freq: Every day | ORAL | Status: DC

## 2014-11-30 MED ORDER — CARVEDILOL 3.125 MG PO TABS: 3.1250 mg | ORAL_TABLET | Freq: Two times a day (BID) | ORAL | Status: DC

## 2014-11-30 NOTE — Patient Instructions (Signed)
Follow up 2 months.  Do the following things EVERYDAY: 1) Weigh yourself in the morning before breakfast. Write it down and keep it in a log. 2) Take your medicines as prescribed 3) Eat low salt foods-Limit salt (sodium) to 2000 mg per day.  4) Stay as active as you can everyday 5) Limit all fluids for the day to less than 2 liters

## 2014-11-30 NOTE — Progress Notes (Signed)
Patient ID: Jeffery Conrad, male   DOB: [birth year], 64 y.o.   MRN: [id number]  Primary Cardiologist: Dr Scarlette Calico PCP: None   HPI: 64 year old male with recent diagnosis of nonischemic cardio myopathy in the setting of drug abuse . He also has a history of hep c, HTN, and smoker.   Admitted 07/26/14 with increased dyspnea and chest pain. Had cath as noted below with recommendations for medical management. Started on HF meds and discharged however he was unable pay for them so he did not start.   He returns for follow up. Last visit lisinopril stopped and losartan started due to cough. Cough has resolved. Also started on 12.5 mg spironolactone. Drinking 20 ounces a day. Denies SOB/PND/Orthopnea/CP. Weight at home 178-182 pounds.Took lasix 3 times this month.  Smoking 3 cigarettes per day. Has not had cocaine in the last 2 weeks. Riding his bike daily 3-4 miles. Does not have a car. Can not pay for  medications.       LHC 07/27/14  Moderate proximal LAD disease bifurcation stenosis. Mild to moderate distal RCA disease. Heavily calcified left coronary  ECHO 07/2014 EF 20-25%   Labs 08/31/14 k 4.3 Creatinine 1.09 Labs 09/30/2014 K 4.8 Creatinine 1.19  Labs 11/09/2014: K 4.3 Creatinine 1.14   SH: Current cocaine abuses, unemployed, lives with girl friend, has difficulty with transportation FH: No know CAD in his family  ROS: All systems negative except as listed in HPI, PMH and Problem List.  Past Medical History  Diagnosis Date  . COPD (chronic obstructive pulmonary disease)   . Hepatitis C     a. 07/2014 HCV AB reactive, HCV quant log: 5.7, HCV quant [id number].  . CKD (chronic kidney disease), stage II   . Fracture 1976    Bilateral forearm compound fractures  . Non-obstructive CAD     a. 07/2014 Cath: LM nl, LAD nonobs, LCX nl, OM nl, RCA 50d, EF 20-25%  . NICM (nonischemic cardiomyopathy)     a. 07/2014 Echo: EF 25-30%, diff HK, Gr 1 DD.  Marland Kitchen Accelerated hypertension   . Tobacco use  disorder   . Chronic systolic CHF (congestive heart failure)     a. 07/2014 Echo: EF 25-30%, diff HK, Gr 1 DD.  . Crack cocaine use   . Noncompliance     Current Outpatient Prescriptions  Medication Sig Dispense Refill  . aspirin 81 MG EC tablet Take 1 tablet (81 mg total) by mouth daily. 30 tablet 3  . atorvastatin (LIPITOR) 20 MG tablet Take 1 tablet (20 mg total) by mouth daily. 30 tablet 2  . carvedilol (COREG) 3.125 MG tablet Take 1 tablet (3.125 mg total) by mouth 2 (two) times daily. 60 tablet 2  . furosemide (LASIX) 20 MG tablet Take 1 tablet (20 mg total) by mouth daily as needed. For weight gain 3 lbs or more overnight. 90 tablet 3  . losartan (COZAAR) 50 MG tablet Take 1 tablet (50 mg total) by mouth daily. 30 tablet 3  . spironolactone (ALDACTONE) 25 MG tablet Take 0.5 tablets (12.5 mg total) by mouth daily. 15 tablet 3   No current facility-administered medications for this encounter.     PHYSICAL EXAM: Filed Vitals:   11/30/14 1054  BP: 109/70  Pulse: 52  Weight: 182 lb 12.8 oz (82.918 kg)  SpO2: 97%    General:  Well appearing. No resp difficulty ambulated in the clinic without difficulty.  HEENT: normal Neck: supple. JVP flat. Carotids 2+ bilaterally; no bruits. No lymphadenopathy  or thryomegaly appreciated. Cor: PMI normal. Regular rate & rhythm. No rubs, gallops or murmurs. No S3  Lungs: clear Abdomen: soft, nontender, nondistended. No hepatosplenomegaly. No bruits or masses. Good bowel sounds. Extremities: no cyanosis, clubbing, rash, edema Neuro: alert & orientedx3, cranial nerves grossly intact. Moves all 4 extremities w/o difficulty. Affect pleasant.     ASSESSMENT & PLAN:  1. Chronic Systolic Heart Failure, NICM 07/2014. Nonobstructive LHC in November 2015. NICM. EF today improving from 20 % to 35-40% NYHA I.  Volume status stable. Continue 40 mg po lasix as needed for 3 pound weight gain in 24 hours.  - Continue 3.125 mg carvedilol twice a day  unable to increase due to low heart rate.  - Stop Ace due to dry cough. Add losartan 50 mg daily.  - Continue 12.5 mg spironolactone daily .  Reinforced daily weights, low salt food choices, limiting fluid intake to < 2 liters per day.  2. Drug Abuse- Ongoing alcohol abuse- I have stressed that he needs to stop and he is interested in stopping however he has been using > 20 years. Needs substance abuse counseling. HF SW trying to follow up but hard to get in touch with him.  3. Hep C - Elevated AST/ALT11/2015 HCV AB reactive, HCV quant log: 5.7, HCV quant [id number]. [facility]er to [facility] but he has not heard from [facility]. Today we will refer him to ID.   4. HTN- Stable. Continue current regimen 5.  Hyperlipidemia- Continue  atorvastatin.  6. Nonobstructive CAD- NSTEMI 07/2014 . Had cath as above.  Continue baby asprin and atorvastatin.  7. Current Tobacco Abuse. - Encouraged to stop smoking.   Follow up 2 months   [name],[name]  11:15 AM  Patient seen and examined with [name], NP. We discussed all aspects of the encounter. I agree with the assessment and plan as stated above.   Clinically doing well. NYHA I-II. Echo images reviewed personally. EF likely 35-40% range - will await formal read. Agree with changing ACE to ARB. Will refer to ID for HCV evaluation. Counseled on need to abstain from smoking and ETOH. Need to be careful with statin in setting of active HCV.  [name] 3:53 PM

## 2014-11-30 NOTE — Progress Notes (Signed)
Echocardiogram 2D Echocardiogram has been performed.  Jeffery Conrad 11/30/2014, 11:59 AM

## 2014-12-28 ENCOUNTER — Other Ambulatory Visit (HOSPITAL_COMMUNITY): Payer: Self-pay

## 2014-12-28 DIAGNOSIS — I5022 Chronic systolic (congestive) heart failure: Secondary | ICD-10-CM

## 2014-12-28 MED ORDER — LOSARTAN POTASSIUM 50 MG PO TABS: 50.0000 mg | ORAL_TABLET | Freq: Every day | ORAL | Status: DC

## 2014-12-28 MED ORDER — CARVEDILOL 3.125 MG PO TABS: 3.1250 mg | ORAL_TABLET | Freq: Two times a day (BID) | ORAL | Status: DC

## 2014-12-28 MED ORDER — FUROSEMIDE 20 MG PO TABS: 20.0000 mg | ORAL_TABLET | Freq: Every day | ORAL | Status: DC | PRN

## 2014-12-28 MED ORDER — ATORVASTATIN CALCIUM 20 MG PO TABS: 20.0000 mg | ORAL_TABLET | Freq: Every day | ORAL | Status: DC

## 2014-12-28 MED ORDER — ASPIRIN 81 MG PO TBEC: 81.0000 mg | DELAYED_RELEASE_TABLET | Freq: Every day | ORAL | Status: DC

## 2014-12-28 MED ORDER — SPIRONOLACTONE 25 MG PO TABS: 12.5000 mg | ORAL_TABLET | Freq: Every day | ORAL | Status: DC

## 2015-01-25 ENCOUNTER — Telehealth (HOSPITAL_COMMUNITY): Payer: Self-pay | Admitting: Vascular Surgery

## 2015-01-25 DIAGNOSIS — I5022 Chronic systolic (congestive) heart failure: Secondary | ICD-10-CM

## 2015-01-25 NOTE — Telephone Encounter (Signed)
Pt called he needs a refill on all his medications

## 2015-01-27 MED ORDER — ATORVASTATIN CALCIUM 20 MG PO TABS: 20.0000 mg | ORAL_TABLET | Freq: Every day | ORAL | Status: DC

## 2015-01-27 MED ORDER — CARVEDILOL 3.125 MG PO TABS: 3.1250 mg | ORAL_TABLET | Freq: Two times a day (BID) | ORAL | Status: DC

## 2015-01-27 MED ORDER — LOSARTAN POTASSIUM 50 MG PO TABS: 50.0000 mg | ORAL_TABLET | Freq: Every day | ORAL | Status: DC

## 2015-01-27 MED ORDER — ASPIRIN 81 MG PO TBEC: 81.0000 mg | DELAYED_RELEASE_TABLET | Freq: Every day | ORAL | Status: DC

## 2015-01-27 MED ORDER — SPIRONOLACTONE 25 MG PO TABS: 12.5000 mg | ORAL_TABLET | Freq: Every day | ORAL | Status: DC

## 2015-01-27 MED ORDER — FUROSEMIDE 20 MG PO TABS: 20.0000 mg | ORAL_TABLET | Freq: Every day | ORAL | Status: DC | PRN

## 2015-02-01 ENCOUNTER — Ambulatory Visit (HOSPITAL_COMMUNITY)
Admission: RE | Admit: 2015-02-01 | Discharge: 2015-02-01 | Disposition: A | Discharge status: Home / Self Care | Payer: Medicare Other | Source: Ambulatory Visit | Attending: Cardiology | Admitting: Cardiology

## 2015-02-01 ENCOUNTER — Encounter (HOSPITAL_COMMUNITY): Payer: Self-pay

## 2015-02-01 VITALS — BP 144/82 | HR 74 | Wt 189.5 lb

## 2015-02-01 DIAGNOSIS — I2510 Atherosclerotic heart disease of native coronary artery without angina pectoris: Secondary | ICD-10-CM | POA: Insufficient documentation

## 2015-02-01 DIAGNOSIS — F17210 Nicotine dependence, cigarettes, uncomplicated: Secondary | ICD-10-CM | POA: Diagnosis not present

## 2015-02-01 DIAGNOSIS — I428 Other cardiomyopathies: Secondary | ICD-10-CM

## 2015-02-01 DIAGNOSIS — F1910 Other psychoactive substance abuse, uncomplicated: Secondary | ICD-10-CM

## 2015-02-01 DIAGNOSIS — N182 Chronic kidney disease, stage 2 (mild): Secondary | ICD-10-CM | POA: Insufficient documentation

## 2015-02-01 DIAGNOSIS — I429 Cardiomyopathy, unspecified: Secondary | ICD-10-CM | POA: Diagnosis not present

## 2015-02-01 DIAGNOSIS — I252 Old myocardial infarction: Secondary | ICD-10-CM | POA: Diagnosis not present

## 2015-02-01 DIAGNOSIS — J449 Chronic obstructive pulmonary disease, unspecified: Secondary | ICD-10-CM | POA: Insufficient documentation

## 2015-02-01 DIAGNOSIS — F1410 Cocaine abuse, uncomplicated: Secondary | ICD-10-CM | POA: Diagnosis not present

## 2015-02-01 DIAGNOSIS — E785 Hyperlipidemia, unspecified: Secondary | ICD-10-CM | POA: Insufficient documentation

## 2015-02-01 DIAGNOSIS — F17200 Nicotine dependence, unspecified, uncomplicated: Secondary | ICD-10-CM

## 2015-02-01 DIAGNOSIS — Z79899 Other long term (current) drug therapy: Secondary | ICD-10-CM | POA: Insufficient documentation

## 2015-02-01 DIAGNOSIS — I129 Hypertensive chronic kidney disease with stage 1 through stage 4 chronic kidney disease, or unspecified chronic kidney disease: Secondary | ICD-10-CM | POA: Insufficient documentation

## 2015-02-01 DIAGNOSIS — Z720 Tobacco use: Secondary | ICD-10-CM

## 2015-02-01 DIAGNOSIS — B1920 Unspecified viral hepatitis C without hepatic coma: Secondary | ICD-10-CM | POA: Insufficient documentation

## 2015-02-01 DIAGNOSIS — I5022 Chronic systolic (congestive) heart failure: Secondary | ICD-10-CM | POA: Insufficient documentation

## 2015-02-01 DIAGNOSIS — Z7982 Long term (current) use of aspirin: Secondary | ICD-10-CM | POA: Insufficient documentation

## 2015-02-01 DIAGNOSIS — I159 Secondary hypertension, unspecified: Secondary | ICD-10-CM

## 2015-02-01 DIAGNOSIS — B1710 Acute hepatitis C without hepatic coma: Secondary | ICD-10-CM

## 2015-02-01 LAB — BASIC METABOLIC PANEL
Anion gap: 8 (ref 5–15)
BUN: 10 mg/dL (ref 6–20)
CALCIUM: 9 mg/dL (ref 8.9–10.3)
CO2: 23 mmol/L (ref 22–32)
Chloride: 105 mmol/L (ref 101–111)
Creatinine, Ser: 1.15 mg/dL (ref 0.61–1.24)
GFR calc Af Amer: >60 mL/min (ref >60)
GFR calc non Af Amer: >60 mL/min (ref >60)
Glucose, Bld: 97 mg/dL (ref 70–99)
POTASSIUM: 3.5 mmol/L (ref 3.5–5.1)
Sodium: 136 mmol/L (ref 135–145)

## 2015-02-01 MED ORDER — SPIRONOLACTONE 25 MG PO TABS: 25.0000 mg | ORAL_TABLET | Freq: Every day | ORAL | Status: DC

## 2015-02-01 NOTE — Patient Instructions (Addendum)
INCREASE Spironolactone to 25 mg (1 tablet) once daily.  We will refer you to [facility] for Infectious Disease. The [facility] for [facility] is located in the [facility] at  [address] in [place]. Our office is on the first floor in [address], which is located to the left of the elevators.  Our office is open Monday through Thursday from 8:30 a.m. until 5 p.m. and on Friday from 9 a.m. until 5 p.m. Closed for lunch daily from 12:30 to 1:30 p.m. Please contact our office at [phone] to schedule an appointment.  Return in 1-2 weeks for repeat lab work.  Follow up 4 weeks.  Do the following things EVERYDAY: 1) Weigh yourself in the morning before breakfast. Write it down and keep it in a log. 2) Take your medicines as prescribed 3) Eat low salt foods-Limit salt (sodium) to 2000 mg per day.  4) Stay as active as you can everyday 5) Limit all fluids for the day to less than 2 liters

## 2015-02-01 NOTE — Progress Notes (Signed)
Patient ID: Jeffery Conrad, male   DOB: 1951-09-02, 64 y.o.   MRN: [id number]  Primary Cardiologist: Dr Scarlette Calico PCP: None   HPI: 64 year old male with recent diagnosis of nonischemic cardio myopathy in the setting of drug abuse . He also has a history of hep c, HTN, and smoker.   Admitted 07/26/14 with increased dyspnea and chest pain. Had cath as noted below with recommendations for medical management. Started on HF meds and discharged however he was unable pay for them so he did not start.   He returns for follow up. Overall feeling good. Denies SOB/PND/Orthopnea. Yesterday walked 4 miles. Weight at home 182-185 pounds. Drinking about 2 beers. Smoking 1/2 pack per day. Has not used cocaine in the last month. Taking all medications but ran out yesterday.      LHC 07/27/14  Moderate proximal LAD disease bifurcation stenosis. Mild to moderate distal RCA disease. Heavily calcified left coronary  ECHO 07/2014 EF 20-25%  ECHO 11/30/2014: EF 25-30% Grade I DD  Labs 08/31/14 k 4.3 Creatinine 1.09 Labs 09/30/2014 K 4.8 Creatinine 1.19  Labs 11/09/2014: K 4.3 Creatinine 1.14    SH: Current cocaine abuses, unemployed, lives with girl friend, has difficulty with transportation FH: No know CAD in his family  ROS: All systems negative except as listed in HPI, PMH and Problem List.  Past Medical History  Diagnosis Date  . COPD (chronic obstructive pulmonary disease)   . Hepatitis C     a. 07/2014 HCV AB reactive, HCV quant log: 5.7, HCV quant [id number].  . CKD (chronic kidney disease), stage II   . Fracture 1976    Bilateral forearm compound fractures  . Non-obstructive CAD     a. 07/2014 Cath: LM nl, LAD nonobs, LCX nl, OM nl, RCA 50d, EF 20-25%  . NICM (nonischemic cardiomyopathy)     a. 07/2014 Echo: EF 25-30%, diff HK, Gr 1 DD.  Marland Kitchen Accelerated hypertension   . Tobacco use disorder   . Chronic systolic CHF (congestive heart failure)     a. 07/2014 Echo: EF 25-30%, diff HK, Gr 1 DD.  .  Crack cocaine use   . Noncompliance     Current Outpatient Prescriptions  Medication Sig Dispense Refill  . aspirin 81 MG EC tablet Take 1 tablet (81 mg total) by mouth daily. 30 tablet 1  . atorvastatin (LIPITOR) 20 MG tablet Take 1 tablet (20 mg total) by mouth daily. 30 tablet 1  . carvedilol (COREG) 3.125 MG tablet Take 1 tablet (3.125 mg total) by mouth 2 (two) times daily. 60 tablet 1  . furosemide (LASIX) 20 MG tablet Take 1 tablet (20 mg total) by mouth daily as needed. For weight gain 3 lbs or more overnight. 15 tablet 1  . losartan (COZAAR) 50 MG tablet Take 1 tablet (50 mg total) by mouth daily. 30 tablet 1  . spironolactone (ALDACTONE) 25 MG tablet Take 0.5 tablets (12.5 mg total) by mouth daily. 15 tablet 1   No current facility-administered medications for this encounter.     PHYSICAL EXAM: Filed Vitals:   02/01/15 1138  BP: 144/82  Pulse: 74  Weight: 189 lb 8 oz (85.957 kg)  SpO2: 98%    General:  Well appearing. No resp difficulty ambulated in the clinic without difficulty.  HEENT: normal Neck: supple. JVP flat. Carotids 2+ bilaterally; no bruits. No lymphadenopathy or thryomegaly appreciated. Cor: PMI normal. Regular rate & rhythm. No rubs, gallops or murmurs. No S3  Lungs: clear Abdomen: soft, nontender,  nondistended. No hepatosplenomegaly. No bruits or masses. Good bowel sounds. Extremities: no cyanosis, clubbing, rash, edema Neuro: alert & orientedx3, cranial nerves grossly intact. Moves all 4 extremities w/o difficulty. Affect pleasant.     ASSESSMENT & PLAN:  1. Chronic Systolic Heart Failure, NICM 07/2014. Nonobstructive LHC in November 2015. NICM.  Had ECHO last visit with EF 25-30%.  NYHA I.  Volume status stable. Continue 40 mg po lasix as needed for 3 pound weight gain in 24 hours.  - Has not had meds today because he ran out. All HF meds provided via HF fund.  Continue 3.125 mg carvedilol twice a day.   - Continue losartan 50 mg daily.  -  Increase  25 mg spironolactone daily .  - Check BMET now.  Reinforced daily weights, low salt food choices, limiting fluid intake to < 2 liters per day.  2. Drug Abuse- Has cut back and I have encouraged to quit.  Needs substance abuse counseling. HF SW trying to follow up but hard to get in touch with him.  3. Hep C - Elevated AST/ALT11/2015 HCV AB reactive, HCV quant log: 5.7, HCV quant [id number]. [facility]er to [facility] but he has not heard from [facility].  Has not heard from ID clininc will refer again.   4. HTN- Elevated but did not have medications today.  5.  Hyperlipidemia- Continue  atorvastatin.  6. Nonobstructive CAD- NSTEMI 07/2014 . Had cath as above.  Continue baby asprin and atorvastatin.  7. Current Tobacco Abuse. - Encouraged to stop smoking.   Follow up 4 weeks. Check BMET in 10 days.  EKG next visit. Refer to SW for medicaid  Joannah Gitlin NP-C  11:41 AM

## 2015-02-02 ENCOUNTER — Telehealth: Payer: Self-pay | Admitting: Licensed Clinical Social Worker

## 2015-02-02 NOTE — Telephone Encounter (Signed)
CSW referred to assist patient with medicaid. CSW contacted patient by phone and he reports he receives $609 monthly from [facility] and also receives a "supplemental disability" check included with his SSA check. CSW explained that it sounds like he might have SSI which should include medicaid and he should contact medicaid to inquire and apply if needed. Patient verbalizes understanding of follow up and will contact CSW if further needs arise. Raquel Sarna, [place]

## 2015-02-15 ENCOUNTER — Ambulatory Visit (HOSPITAL_COMMUNITY)
Admission: RE | Admit: 2015-02-15 | Discharge: 2015-02-15 | Disposition: A | Discharge status: Home / Self Care | Payer: Medicare Other | Source: Ambulatory Visit | Attending: Cardiology | Admitting: Cardiology

## 2015-02-15 ENCOUNTER — Other Ambulatory Visit (HOSPITAL_COMMUNITY): Payer: Self-pay | Admitting: *Deleted

## 2015-02-15 DIAGNOSIS — I5022 Chronic systolic (congestive) heart failure: Secondary | ICD-10-CM | POA: Insufficient documentation

## 2015-02-15 LAB — BASIC METABOLIC PANEL
ANION GAP: 10 (ref 5–15)
BUN: 9 mg/dL (ref 6–20)
CALCIUM: 9.3 mg/dL (ref 8.9–10.3)
CO2: 21 mmol/L — AB (ref 22–32)
Chloride: 107 mmol/L (ref 101–111)
Creatinine, Ser: 1.22 mg/dL (ref 0.61–1.24)
GFR calc Af Amer: >60 mL/min (ref >60)
Glucose, Bld: 84 mg/dL (ref 65–99)
Potassium: 4.1 mmol/L (ref 3.5–5.1)
Sodium: 138 mmol/L (ref 135–145)

## 2015-02-15 MED ORDER — SPIRONOLACTONE 25 MG PO TABS: 25.0000 mg | ORAL_TABLET | Freq: Every day | ORAL | Status: DC

## 2015-03-01 ENCOUNTER — Encounter: Payer: Self-pay | Admitting: Internal Medicine

## 2015-03-01 ENCOUNTER — Ambulatory Visit (INDEPENDENT_AMBULATORY_CARE_PROVIDER_SITE_OTHER): Payer: Medicare Other | Admitting: Internal Medicine

## 2015-03-01 ENCOUNTER — Encounter (HOSPITAL_COMMUNITY): Payer: Self-pay

## 2015-03-01 ENCOUNTER — Ambulatory Visit (HOSPITAL_COMMUNITY)
Admission: RE | Admit: 2015-03-01 | Discharge: 2015-03-01 | Disposition: A | Discharge status: Home / Self Care | Payer: Medicare Other | Source: Ambulatory Visit | Attending: Internal Medicine | Admitting: Internal Medicine

## 2015-03-01 VITALS — BP 130/76 | HR 62 | Temp 98.1°F | Ht 72.0 in | Wt 188.0 lb

## 2015-03-01 VITALS — BP 111/54 | HR 71 | Resp 18 | Wt 187.5 lb

## 2015-03-01 DIAGNOSIS — I5022 Chronic systolic (congestive) heart failure: Secondary | ICD-10-CM | POA: Diagnosis not present

## 2015-03-01 DIAGNOSIS — Z720 Tobacco use: Secondary | ICD-10-CM | POA: Diagnosis not present

## 2015-03-01 DIAGNOSIS — F1910 Other psychoactive substance abuse, uncomplicated: Secondary | ICD-10-CM | POA: Diagnosis not present

## 2015-03-01 DIAGNOSIS — B182 Chronic viral hepatitis C: Secondary | ICD-10-CM

## 2015-03-01 DIAGNOSIS — I10 Essential (primary) hypertension: Secondary | ICD-10-CM | POA: Insufficient documentation

## 2015-03-01 DIAGNOSIS — I159 Secondary hypertension, unspecified: Secondary | ICD-10-CM | POA: Insufficient documentation

## 2015-03-01 DIAGNOSIS — F17200 Nicotine dependence, unspecified, uncomplicated: Secondary | ICD-10-CM

## 2015-03-01 MED ORDER — CARVEDILOL 6.25 MG PO TABS: 6.2500 mg | ORAL_TABLET | Freq: Two times a day (BID) | ORAL | Status: DC

## 2015-03-01 MED ORDER — ATORVASTATIN CALCIUM 20 MG PO TABS: 20.0000 mg | ORAL_TABLET | Freq: Every day | ORAL | Status: DC

## 2015-03-01 MED ORDER — FUROSEMIDE 20 MG PO TABS: 20.0000 mg | ORAL_TABLET | Freq: Every day | ORAL | Status: DC | PRN

## 2015-03-01 MED ORDER — ASPIRIN 81 MG PO TBEC: 81.0000 mg | DELAYED_RELEASE_TABLET | Freq: Every day | ORAL | Status: DC

## 2015-03-01 MED ORDER — SPIRONOLACTONE 25 MG PO TABS: 25.0000 mg | ORAL_TABLET | Freq: Every day | ORAL | Status: DC

## 2015-03-01 MED ORDER — LOSARTAN POTASSIUM 50 MG PO TABS: 50.0000 mg | ORAL_TABLET | Freq: Every day | ORAL | Status: DC

## 2015-03-01 NOTE — Progress Notes (Signed)
Patient ID: Jeffery Conrad, male   DOB: 02-Aug-1951, 64 y.o.   MRN: [id number] Patient ID: Jeffery Conrad, male   DOB: Sep 25, 1950, 64 y.o.   MRN: [id number]  Primary Cardiologist: Dr Scarlette Calico PCP: None   HPI: 64 year old male with recent diagnosis of nonischemic cardio myopathy in the setting of drug abuse . He also has a history of hep c, HTN, and smoker.   Admitted 07/26/14 with increased dyspnea and chest pain. Had cath as noted below with recommendations for medical management. Started on HF meds and discharged however he was unable pay for them so he did not start.   He returns for follow up. Last visit spiro was increased to 25 mg daily. Denies SOB/PND/Orthopnea. Yesterday walked 4 miles and riding bike to all appointments. Weight at home 182-185 pounds. Drinking about 1 beer a day.  Smoking 1/2 pack per day. Used cocaine in last few weeks.  Taking all medications.       LHC 07/27/14  Moderate proximal LAD disease bifurcation stenosis. Mild to moderate distal RCA disease. Heavily calcified left coronary  ECHO 07/2014 EF 20-25%  ECHO 11/30/2014: EF 25-30% Grade I DD  Labs 08/31/14 k 4.3 Creatinine 1.09 Labs 09/30/2014 K 4.8 Creatinine 1.19  Labs 11/09/2014: K 4.3 Creatinine 1.14  Labs 02/15/2015: K 4.1 Creatinine 1.22  SH: Current cocaine abuses, unemployed, lives with girl friend, has difficulty with transportation FH: No know CAD in his family  ROS: All systems negative except as listed in HPI, PMH and Problem List.  Past Medical History  Diagnosis Date  . COPD (chronic obstructive pulmonary disease)   . Hepatitis C     a. 07/2014 HCV AB reactive, HCV quant log: 5.7, HCV quant [id number].  . CKD (chronic kidney disease), stage II   . Fracture 1976    Bilateral forearm compound fractures  . Non-obstructive CAD     a. 07/2014 Cath: LM nl, LAD nonobs, LCX nl, OM nl, RCA 50d, EF 20-25%  . NICM (nonischemic cardiomyopathy)     a. 07/2014 Echo: EF 25-30%, diff HK, Gr 1 DD.  Marland Kitchen  Accelerated hypertension   . Tobacco use disorder   . Chronic systolic CHF (congestive heart failure)     a. 07/2014 Echo: EF 25-30%, diff HK, Gr 1 DD.  . Crack cocaine use   . Noncompliance     Current Outpatient Prescriptions  Medication Sig Dispense Refill  . aspirin 81 MG EC tablet Take 1 tablet (81 mg total) by mouth daily. 30 tablet 1  . atorvastatin (LIPITOR) 20 MG tablet Take 1 tablet (20 mg total) by mouth daily. 30 tablet 1  . carvedilol (COREG) 3.125 MG tablet Take 1 tablet (3.125 mg total) by mouth 2 (two) times daily. 60 tablet 1  . furosemide (LASIX) 20 MG tablet Take 1 tablet (20 mg total) by mouth daily as needed. For weight gain 3 lbs or more overnight. 15 tablet 1  . losartan (COZAAR) 50 MG tablet Take 1 tablet (50 mg total) by mouth daily. 30 tablet 1  . spironolactone (ALDACTONE) 25 MG tablet Take 1 tablet (25 mg total) by mouth daily. 30 tablet 3   No current facility-administered medications for this encounter.     PHYSICAL EXAM: Filed Vitals:   03/01/15 1130  BP: 111/54  Pulse: 71  Resp: 18  Weight: 187 lb 8 oz (85.049 kg)  SpO2: 98%    General:  Well appearing. No resp difficulty ambulated in the clinic without difficulty.  HEENT:  normal Neck: supple. JVP flat. Carotids 2+ bilaterally; no bruits. No lymphadenopathy or thryomegaly appreciated. Cor: PMI normal. Regular rate & rhythm. No rubs, gallops or murmurs. No S3  Lungs: clear Abdomen: soft, nontender, nondistended. No hepatosplenomegaly. No bruits or masses. Good bowel sounds. Extremities: no cyanosis, clubbing, rash, edema Neuro: alert & orientedx3, cranial nerves grossly intact. Moves all 4 extremities w/o difficulty. Affect pleasant.     ASSESSMENT & PLAN:  1. Chronic Systolic Heart Failure, NICM 07/2014. Nonobstructive LHC in November 2015. NICM.  Had ECHO 11/2014 EF 25-30%. He is NYHA Class I. So does not meet criteria for ICD.  Volume status stable. Continue 40 mg po lasix as needed for 3  pound weight gain in 24 hours.  -Increase carvedilol 6.25 mg twice a day.   - Continue losartan 50 mg daily.  - Continue 25 mg spironolactone daily .  Reinforced daily weights, low salt food choices, limiting fluid intake to < 2 liters per day.  2. Drug Abuse- Has cut back and I have encouraged to quit.  Needs substance abuse counseling. HF SW trying to follow up but hard to get in touch with him.  3. Hep C - Elevated AST/ALT11/2015 HCV AB reactive, HCV quant log: 5.7, HCV quant [id number]. [facility]er to [facility] but he has not heard from [facility].  To see ID today.    4. HTN- Stable.  5.  Hyperlipidemia- Continue  atorvastatin.  6. Nonobstructive CAD- NSTEMI 07/2014 . Had cath as above.  Continue baby asprin and atorvastatin.  7. Current Tobacco Abuse. - Encouraged to stop smoking.  8. Cocaine Abuse- Started using again. Encouraged to stop.   Follow up 6 weeks.  [name],[name]  11:37 AM

## 2015-03-01 NOTE — Patient Instructions (Signed)
INCREASE Carvedilol (Coreg) 6.25 mg twice daily.  All refills sent to [facility].  Follow up 6 weeks.  Do the following things EVERYDAY: 1) Weigh yourself in the morning before breakfast. Write it down and keep it in a log. 2) Take your medicines as prescribed 3) Eat low salt foods-Limit salt (sodium) to 2000 mg per day.  4) Stay as active as you can everyday 5) Limit all fluids for the day to less than 2 liters

## 2015-03-01 NOTE — Progress Notes (Signed)
+Jeffery Conrad is a 64 y.o. male who presents for initial evaluation and management of a positive Hepatitis C antibody test.  Patient tested positive last year during hospitalization. Hepatitis C risk factors present are: cocaine use, smoking and snorting. Patient denies IV drug abuse. Patient has had other studies performed. Results: hepatitis C RNA by PCR, result: positive. Patient has not had prior treatment for Hepatitis C. Patient does not have a past history of liver disease. Patient does not have a family history of liver disease.   HPI: He is actively using crack cocaine, was cocaine positive in hospital in November 2015.  Is interested in quitting.    Patient does not have documented immunity to Hepatitis A. Patient does not have documented immunity to Hepatitis B.     Review of Systems A comprehensive review of systems was negative.   Past Medical History  Diagnosis Date  . COPD (chronic obstructive pulmonary disease)   . Hepatitis C     a. 07/2014 HCV AB reactive, HCV quant log: 5.7, HCV quant [id number].  . CKD (chronic kidney disease), stage II   . Fracture 1976    Bilateral forearm compound fractures  . Non-obstructive CAD     a. 07/2014 Cath: LM nl, LAD nonobs, LCX nl, OM nl, RCA 50d, EF 20-25%  . NICM (nonischemic cardiomyopathy)     a. 07/2014 Echo: EF 25-30%, diff HK, Gr 1 DD.  Marland Kitchen Accelerated hypertension   . Tobacco use disorder   . Chronic systolic CHF (congestive heart failure)     a. 07/2014 Echo: EF 25-30%, diff HK, Gr 1 DD.  . Crack cocaine use   . Noncompliance     Prior to Admission medications   Medication Sig Start Date End Date Taking? Authorizing Provider  aspirin 81 MG EC tablet Take 1 tablet (81 mg total) by mouth daily. 03/01/15  Yes Amy D [name], NP  atorvastatin (LIPITOR) 20 MG tablet Take 1 tablet (20 mg total) by mouth daily. 03/01/15  Yes Amy D [name], NP  carvedilol (COREG) 6.25 MG tablet Take 1 tablet (6.25 mg total) by mouth 2 (two) times daily.  03/01/15  Yes Amy D [name], NP  losartan (COZAAR) 50 MG tablet Take 1 tablet (50 mg total) by mouth daily. 03/01/15  Yes Amy D [name], NP  spironolactone (ALDACTONE) 25 MG tablet Take 1 tablet (25 mg total) by mouth daily. 03/01/15  Yes Amy D [name], NP  furosemide (LASIX) 20 MG tablet Take 1 tablet (20 mg total) by mouth daily as needed. For weight gain 3 lbs or more overnight. Patient not taking: Reported on 03/01/2015 03/01/15   Amy D Ninfa Meeker, NP    No Known Allergies  History  Substance Use Topics  . Smoking status: Current Every Day Smoker -- 0.50 packs/day for 40 years    Types: Cigarettes  . Smokeless tobacco: Never Used  . Alcohol Use: 4.2 oz/week    7 Cans of beer per week     Comment: beer    No family history on file.    Objective:   Filed Vitals:   03/01/15 1416  BP: 130/76  Pulse: 62  Temp: 98.1 F (36.7 C)   in no apparent distress and alert HEENT: anicteric Cor RRR and No murmurs clear Bowel sounds are normal, liver is not enlarged, spleen is not enlarged peripheral pulses normal, no pedal edema, no clubbing or cyanosis negative for - jaundice, spider hemangioma, telangiectasia, palmar erythema, ecchymosis and atrophy  Laboratory Genotype:  Lab Results  Component Value Date   HCVGENOTYPE 1a 07/28/2014   HCV viral load:  Lab Results  Component Value Date   HCVQUANT [id number]* 07/28/2014   Lab Results  Component Value Date   WBC 7.6 07/29/2014   HGB 15.1 07/29/2014   HCT 43.2 07/29/2014   MCV 86.1 07/29/2014   PLT 154 07/29/2014    Lab Results  Component Value Date   CREATININE 1.22 02/15/2015   BUN 9 02/15/2015   NA 138 02/15/2015   K 4.1 02/15/2015   CL 107 02/15/2015   CO2 21* 02/15/2015    Lab Results  Component Value Date   ALT 56* 07/29/2014   AST 53* 07/29/2014   ALKPHOS 66 07/29/2014   BILITOT 0.8 07/29/2014   INR 1.05 07/26/2014      Assessment: Chronic Hepatitis C genotype 1a  Plan: 1) Patient counseled extensively on limiting  acetaminophen to no more than 2 grams daily, avoidance of alcohol. 2) Transmission discussed with patient including sexual transmission, sharing razors and toothbrush.   3) Will need referral to gastroenterology if concern for cirrhosis.  Discussed risk for cirrhosis, development of hepatocellular carcinoma.  4) Will need referral for substance abuse counseling: Yes.  will need active counseling and negative drug testing prior to consideration of treatment.  5) Will prescribe Harvoni for 12 weeks once work up complete 6) Hepatitis A vaccine Yes.   7) Hepatitis B vaccine Yes.  will start at next appt when he returns.  8) Pneumovax vaccine if concern for cirrhosis 9) will follow up after drug counseling in about 2 months.  Will update labs and consider treatment then.

## 2015-03-01 NOTE — Patient Instructions (Signed)
Date 03/01/2015  Dear Mr. Carneal, As discussed in the [facility], your hepatitis C therapy will include the following medications:          Harvoni 90mg /400mg  tablet:           Take 1 tablet by mouth once daily   Please note that ALL MEDICATIONS WILL START ON THE SAME DATE for a total of 12 weeks. ---------------------------------------------------------------- Your HCV Treatment Start Date: TBA   Your HCV genotype:  1a    Liver Fibrosis: TBD    ---------------------------------------------------------------- YOUR PHARMACY CONTACT:   [place] [facility] and [place] Phone: [phone] Hours: Monday to Friday 7:30 am to 6:00 pm   Please always contact your pharmacy at least 3-4 business days before you run out of medications to ensure your next month's medication is ready or 1 week prior to running out if you receive it by mail.  Remember, each prescription is for 28 days. ---------------------------------------------------------------- GENERAL NOTES REGARDING YOUR HEPATITIS C MEDICATION:  SOFOSBUVIR/LEDIPASVIR (HARVONI): - Harvoni tablet is taken daily with OR without food. - The tablets are orange. - The tablets should be stored at room temperature.  - Acid reducing agents such as H2 blockers (ie. Pepcid (famotidine), Zantac (ranitidine), Tagamet (cimetidine), Axid (nizatidine) and proton pump inhibitors (ie. Prilosec (omeprazole), Protonix (pantoprazole), Nexium (esomeprazole), or Aciphex (rabeprazole)) can decrease effectiveness of Harvoni. Do not take until you have discussed with a health care provider.    -Antacids that contain magnesium and/or aluminum hydroxide (ie. Milk of Magensia, Rolaids, Gaviscon, Maalox, Mylanta, an dArthritis Pain Formula)can reduce absorption of Harvoni, so take them at least 4 hours before or after Harvoni.  -Calcium carbonate (calcium supplements or antacids such as Tums, Caltrate,  Os-Cal)needs to be taken at least 4 hours hours before or after Harvoni.  -St. John's wort or any products that contain St. John's wort like some herbal supplements  Please inform the office prior to starting any of these medications.  - The common side effects with Harvoni:      1. Fatigue      2. Headache      3. Nausea      4. Diarrhea      5. Insomnia   Support Path is a suite of resources designed to help patients start with HARVONI and move toward treatment completion [facility] helps patients access therapy and get off to an efficient start  Benefits investigation and prior authorization support Co-pay and other financial assistance A specialty pharmacy finder CO-PAY COUPON [facility] co-pay coupon may help eligible patients lower their out-of-pocket costs. With a co-pay coupon, most eligible patients may pay no more than $5 per co-pay (restrictions apply) www.harvoni.com call (978)851-1451 Not valid for patients enrolled in government healthcare prescription drug programs, such as Medicare Part D and Medicaid. Patients in the coverage gap known as the "donut hole" also are not eligible The HARVONI co-pay coupon program will cover the out-of-pocket costs for HARVONI prescriptions up to a maximum of 25% of the catalog price of a 12-week regimen of HARVONI  Please note that this only lists the most common side effects and is NOT a comprehensive list of the potential side effects of these medications. For more information, please review the drug information sheets that come with your medication package from the pharmacy.  ---------------------------------------------------------------- GENERAL HELPFUL HINTS ON HCV THERAPY: 1. No alcohol. 2. Protect against sun-sensitivity/sunburns (wear sunglasses, hat, long sleeves, pants  and sunscreen). 3. Stay well-hydrated/well-moisturized. 4. Notify the [facility] of any changes in your other over-the-counter/herbal or  prescription medications. 5. If you miss a dose of your medication, take the missed dose as soon as you remember. Return to your regular time/dose schedule the next day.  6.  Do not stop taking your medications without first talking with your healthcare provider. 7.  You may take Tylenol (acetaminophen), as long as the dose is less than 2000 mg (OR no more than 4 tablets of the Tylenol Extra Strengths '500mg'$  tablet) in 24 hours. 8.  You will need to obtain routine labs and/or office visits at [facility] at weeks 4 and 12 as well as 12 and 24 weeks after completion of treatment.   Scharlene Gloss, [place] for [place] [address] [place], [place]  [phone]

## 2015-03-14 ENCOUNTER — Other Ambulatory Visit (HOSPITAL_COMMUNITY): Payer: Self-pay

## 2015-03-14 DIAGNOSIS — I5022 Chronic systolic (congestive) heart failure: Secondary | ICD-10-CM

## 2015-03-28 ENCOUNTER — Ambulatory Visit (HOSPITAL_COMMUNITY): Payer: Medicare Other

## 2015-03-30 ENCOUNTER — Ambulatory Visit: Payer: Medicare Other | Admitting: *Deleted

## 2015-04-06 ENCOUNTER — Ambulatory Visit: Payer: Medicare Other | Admitting: *Deleted

## 2015-04-06 DIAGNOSIS — F1410 Cocaine abuse, uncomplicated: Secondary | ICD-10-CM

## 2015-04-11 NOTE — BH Specialist Note (Signed)
Jeffery Conrad was present for his scheduled appointment today.  Client was oriented times four with good affect and dress.  Client was alert and somewhat talkative.  Client shared that he has been using cocaine via smoking crack since the 80's.  Client indicated that he uses about 2-3 times a week but counselor feels that he is under reporting.  Client says that his live in girlfriend uses also and so it makes the environment difficult to stop using. Client admits he has had residential substance abuse treatment before in the past but relapsed one week after leaving.  Client was respectful and cooperative today with counselor but did not appear to be invested in true recovery at this time.  Counselor educated client on the negative aspects of using harmful substances while sick and on medication.  Client agreed but says it is easier said than done.  Counselor recommended client attend outpatient substance abuse treatment and gave him an official referral.  Client said he will consider going to it.  Counselor considers client in the contemplation stage of change at the moment.  Counselor provided support and encouragement for client today.  Client made another appointment to visit with counselor in two weeks.   Joyice Faster, MA [facility]

## 2015-04-13 ENCOUNTER — Ambulatory Visit (HOSPITAL_COMMUNITY)
Admission: RE | Admit: 2015-04-13 | Discharge: 2015-04-13 | Disposition: A | Discharge status: Home / Self Care | Payer: Medicare Other | Source: Ambulatory Visit | Attending: Internal Medicine | Admitting: Internal Medicine

## 2015-04-13 ENCOUNTER — Encounter (HOSPITAL_COMMUNITY): Payer: Self-pay

## 2015-04-13 VITALS — BP 130/62 | HR 58 | Wt 186.8 lb

## 2015-04-13 DIAGNOSIS — I5022 Chronic systolic (congestive) heart failure: Secondary | ICD-10-CM | POA: Insufficient documentation

## 2015-04-13 DIAGNOSIS — F1910 Other psychoactive substance abuse, uncomplicated: Secondary | ICD-10-CM | POA: Diagnosis not present

## 2015-04-13 LAB — BASIC METABOLIC PANEL
Anion gap: 7 (ref 5–15)
BUN: 23 mg/dL — AB (ref 6–20)
CALCIUM: 9.1 mg/dL (ref 8.9–10.3)
CO2: 22 mmol/L (ref 22–32)
Chloride: 108 mmol/L (ref 101–111)
Creatinine, Ser: 1.51 mg/dL — ABNORMAL HIGH (ref 0.61–1.24)
GFR calc Af Amer: 54 mL/min — ABNORMAL LOW (ref >60)
GFR, EST NON AFRICAN AMERICAN: 46 mL/min — AB (ref >60)
Glucose, Bld: 89 mg/dL (ref 65–99)
POTASSIUM: 4.4 mmol/L (ref 3.5–5.1)
Sodium: 137 mmol/L (ref 135–145)

## 2015-04-13 MED ORDER — CARVEDILOL 6.25 MG PO TABS: 9.3750 mg | ORAL_TABLET | Freq: Two times a day (BID) | ORAL | Status: DC

## 2015-04-13 NOTE — Progress Notes (Signed)
Patient ID: Jeffery Conrad, male   DOB: 28-Apr-1951, 64 y.o.   MRN: [id number]  Primary Cardiologist: Dr Scarlette Calico HF: Bensimhon PCP: None   HPI: 64 year old male with systolic HF due to nonischemic cardiomyopathy (diagnosed 11/15) in the setting of drug abuse . He also has a history of HCV, HTN, and smoker.   Admitted 07/26/14 with increased dyspnea and chest pain. Had cath as noted below with recommendations for medical management. EF 20-25% Started on HF meds and discharged however he was unable pay for them so he did not start.   Echo 3/16 EF 25-30%  He returns for HF follow up. Last visit coreg was increased to 6.25 mg BID. Denies SOB/PND/Orthopnea. Walking miles and riding bike to all appointments without any trouble. Weight at home 182-188 pounds. Down 1 lb since last visit.  Has not had to use extra lasix. Drinks a beer every few days. Still smoking 1/2 pack per day. Continues cocaine in last few weeks. Uses every few weeks. Taking all medications.   Denies CP     LHC 07/27/14  Moderate proximal LAD disease bifurcation stenosis. Mild to moderate distal RCA disease. Heavily calcified left coronary  ECHO 07/2014 EF 20-25%  ECHO 11/30/2014: EF 25-30% Grade I DD  Labs 08/31/14 k 4.3 Creatinine 1.09 Labs 09/30/2014 K 4.8 Creatinine 1.19  Labs 11/09/2014: K 4.3 Creatinine 1.14  Labs 02/15/2015: K 4.1 Creatinine 1.22  SH: Current cocaine abuses, unemployed, lives with girl friend, has difficulty with transportation FH: No know CAD in his family  ROS: All systems negative except as listed in HPI, PMH and Problem List.  Past Medical History  Diagnosis Date  . COPD (chronic obstructive pulmonary disease)   . Hepatitis C     a. 07/2014 HCV AB reactive, HCV quant log: 5.7, HCV quant [id number].  . CKD (chronic kidney disease), stage II   . Fracture 1976    Bilateral forearm compound fractures  . Non-obstructive CAD     a. 07/2014 Cath: LM nl, LAD nonobs, LCX nl, OM nl, RCA 50d, EF 20-25%  .  NICM (nonischemic cardiomyopathy)     a. 07/2014 Echo: EF 25-30%, diff HK, Gr 1 DD.  Marland Kitchen Accelerated hypertension   . Tobacco use disorder   . Chronic systolic CHF (congestive heart failure)     a. 07/2014 Echo: EF 25-30%, diff HK, Gr 1 DD.  . Crack cocaine use   . Noncompliance     Current Outpatient Prescriptions  Medication Sig Dispense Refill  . aspirin 81 MG EC tablet Take 1 tablet (81 mg total) by mouth daily. 30 tablet 1  . atorvastatin (LIPITOR) 20 MG tablet Take 1 tablet (20 mg total) by mouth daily. 30 tablet 1  . carvedilol (COREG) 6.25 MG tablet Take 1 tablet (6.25 mg total) by mouth 2 (two) times daily. 60 tablet 3  . furosemide (LASIX) 20 MG tablet Take 1 tablet (20 mg total) by mouth daily as needed. For weight gain 3 lbs or more overnight. 15 tablet 1  . losartan (COZAAR) 50 MG tablet Take 1 tablet (50 mg total) by mouth daily. 30 tablet 1  . spironolactone (ALDACTONE) 25 MG tablet Take 1 tablet (25 mg total) by mouth daily. 30 tablet 3   No current facility-administered medications for this encounter.     PHYSICAL EXAM: Filed Vitals:   04/13/15 1112  BP: 130/62  Pulse: 58  Weight: 186 lb 12.8 oz (84.732 kg)  SpO2: 97%    General:  Well  appearing. No resp difficulty ambulated in the clinic without difficulty.  HEENT: normal Neck: supple. JVP flat. Carotids 2+ bilaterally; no bruits. No lymphadenopathy or thryomegaly appreciated. Cor: PMI normal. Regular rate & rhythm. No rubs, gallops or murmurs. No S3  Lungs: clear Abdomen: soft, nontender, nondistended. No hepatosplenomegaly. No bruits or masses. Good bowel sounds. Extremities: no cyanosis, clubbing, rash, edema Neuro: alert & orientedx3, cranial nerves grossly intact. Moves all 4 extremities w/o difficulty. Affect pleasant.   ASSESSMENT & PLAN:  1. Chronic Systolic Heart Failure, NICM 07/2014. Nonobstructive LHC in November 2015. NICM.  Had ECHO 11/2014 EF 25-30%. He is NYHA Class I. So does not meet  criteria for ICD.  Volume status stable.  - Continue 40 mg po lasix as needed for 3 pound weight gain in 24 hours.  - Increase carvedilol to 9.375 mg twice a day.   - Continue losartan 50 mg daily.  - Continue 25 mg spironolactone daily .  Reinforced daily weights, low salt food choices, limiting fluid intake to < 2 liters per day.  2. Drug Abuse- Has cut back and I have encouraged to quit.  Needs substance abuse counseling. HF SW trying to follow but difficult to get in touch with him.  3. Hep C - Elevated AST/ALT11/2015 HCV AB reactive, HCV quant log: 5.7, HCV quant [id number]. Has been seen by ID, not going to start on medicine right now, still going through inspections.  Liver US next week.  4. HTN- Stable.  5.  Hyperlipidemia- Continue atorvastatin.  6. Nonobstructive CAD- NSTEMI 07/2014 . Had cath as above.  Continue baby asprin and atorvastatin.  7. Current Tobacco Abuse - Encouraged to stop smoking.  8. Cocaine Abuse- Started using again. Encouraged to stop.   Follow up 6-8 weeks. Repeat Echo.  Shirley Friar PA-C 11:20 AM   Patient seen and examined with Oda Kilts, PA-C. We discussed all aspects of the encounter. I agree with the assessment and plan as stated above.   HF is stable. NYHA I and volume status ok. Agree with increasing carvedilol. Stressed need to abstain from substance abuse. F/u 2 months with repeat echo.   Bensimhon, Daniel,MD 11:25 PM

## 2015-04-13 NOTE — Patient Instructions (Signed)
Increase Carvedilol to 9.375 mg (1 & 1/2 tabs) Twice daily   Labs today  Your physician has requested that you have an echocardiogram. Echocardiography is a painless test that uses sound waves to create images of your heart. It provides your doctor with information about the size and shape of your heart and how well your heart's chambers and valves are working. This procedure takes approximately one hour. There are no restrictions for this procedure.  IN 6-8 WEEKS WITH FOLLOW UP  Your physician recommends that you schedule a follow-up appointment in: 6-8 weeks

## 2015-04-17 ENCOUNTER — Telehealth (HOSPITAL_COMMUNITY): Payer: Self-pay

## 2015-04-17 NOTE — Telephone Encounter (Signed)
Called to remind pt of 11am appt at [facility]...left message for pt to return call. AW

## 2015-04-18 ENCOUNTER — Ambulatory Visit (HOSPITAL_COMMUNITY)
Admission: RE | Admit: 2015-04-18 | Discharge: 2015-04-18 | Disposition: A | Discharge status: Home / Self Care | Payer: Medicare Other | Source: Ambulatory Visit | Attending: Internal Medicine | Admitting: Internal Medicine

## 2015-04-18 DIAGNOSIS — B182 Chronic viral hepatitis C: Secondary | ICD-10-CM | POA: Insufficient documentation

## 2015-04-18 DIAGNOSIS — I77811 Abdominal aortic ectasia: Secondary | ICD-10-CM | POA: Insufficient documentation

## 2015-04-19 ENCOUNTER — Ambulatory Visit (HOSPITAL_COMMUNITY)
Admission: RE | Admit: 2015-04-19 | Discharge: 2015-04-19 | Disposition: A | Discharge status: Home / Self Care | Payer: Medicare Other | Source: Ambulatory Visit | Attending: Internal Medicine | Admitting: Internal Medicine

## 2015-04-19 DIAGNOSIS — I5022 Chronic systolic (congestive) heart failure: Secondary | ICD-10-CM | POA: Insufficient documentation

## 2015-04-19 LAB — BASIC METABOLIC PANEL
Anion gap: 7 (ref 5–15)
BUN: 37 mg/dL — ABNORMAL HIGH (ref 6–20)
CALCIUM: 8.7 mg/dL — AB (ref 8.9–10.3)
CO2: 23 mmol/L (ref 22–32)
CREATININE: 3.18 mg/dL — AB (ref 0.61–1.24)
Chloride: 105 mmol/L (ref 101–111)
GFR calc Af Amer: 22 mL/min — ABNORMAL LOW (ref >60)
GFR, EST NON AFRICAN AMERICAN: 19 mL/min — AB (ref >60)
Glucose, Bld: 103 mg/dL — ABNORMAL HIGH (ref 65–99)
Potassium: 3.5 mmol/L (ref 3.5–5.1)
SODIUM: 135 mmol/L (ref 135–145)

## 2015-05-01 ENCOUNTER — Ambulatory Visit: Payer: Medicare Other | Admitting: Internal Medicine

## 2015-05-02 ENCOUNTER — Encounter (HOSPITAL_COMMUNITY): Payer: Self-pay | Admitting: *Deleted

## 2015-05-05 ENCOUNTER — Other Ambulatory Visit (HOSPITAL_COMMUNITY): Payer: Self-pay | Admitting: Student

## 2015-05-05 ENCOUNTER — Ambulatory Visit (HOSPITAL_COMMUNITY)
Admission: RE | Admit: 2015-05-05 | Discharge: 2015-05-05 | Disposition: A | Discharge status: Home / Self Care | Payer: Medicare Other | Source: Ambulatory Visit | Attending: Internal Medicine | Admitting: Internal Medicine

## 2015-05-05 ENCOUNTER — Other Ambulatory Visit (HOSPITAL_COMMUNITY): Payer: Self-pay | Admitting: *Deleted

## 2015-05-05 DIAGNOSIS — I5022 Chronic systolic (congestive) heart failure: Secondary | ICD-10-CM

## 2015-05-05 DIAGNOSIS — I5021 Acute systolic (congestive) heart failure: Secondary | ICD-10-CM | POA: Diagnosis not present

## 2015-05-05 DIAGNOSIS — I509 Heart failure, unspecified: Secondary | ICD-10-CM

## 2015-05-05 LAB — BASIC METABOLIC PANEL
ANION GAP: 8 (ref 5–15)
BUN: 11 mg/dL (ref 6–20)
CALCIUM: 9 mg/dL (ref 8.9–10.3)
CHLORIDE: 106 mmol/L (ref 101–111)
CO2: 23 mmol/L (ref 22–32)
Creatinine, Ser: 1.29 mg/dL — ABNORMAL HIGH (ref 0.61–1.24)
GFR calc Af Amer: >60 mL/min (ref >60)
GFR calc non Af Amer: 56 mL/min — ABNORMAL LOW (ref >60)
GLUCOSE: 117 mg/dL — AB (ref 65–99)
Potassium: 3.9 mmol/L (ref 3.5–5.1)
Sodium: 137 mmol/L (ref 135–145)

## 2015-05-05 MED ORDER — SPIRONOLACTONE 25 MG PO TABS: 25.0000 mg | ORAL_TABLET | Freq: Every day | ORAL | Status: DC

## 2015-05-05 MED ORDER — FUROSEMIDE 20 MG PO TABS: 20.0000 mg | ORAL_TABLET | Freq: Every day | ORAL | Status: DC | PRN

## 2015-05-05 MED ORDER — ASPIRIN 81 MG PO TBEC: 81.0000 mg | DELAYED_RELEASE_TABLET | Freq: Every day | ORAL | Status: DC

## 2015-05-05 MED ORDER — ATORVASTATIN CALCIUM 20 MG PO TABS: 20.0000 mg | ORAL_TABLET | Freq: Every day | ORAL | Status: DC

## 2015-05-05 MED ORDER — CARVEDILOL 6.25 MG PO TABS: 9.3750 mg | ORAL_TABLET | Freq: Two times a day (BID) | ORAL | Status: DC

## 2015-05-05 MED ORDER — LOSARTAN POTASSIUM 50 MG PO TABS: 50.0000 mg | ORAL_TABLET | Freq: Every day | ORAL | Status: DC

## 2015-05-09 ENCOUNTER — Ambulatory Visit: Payer: Medicare Other | Admitting: *Deleted

## 2015-05-11 ENCOUNTER — Telehealth: Payer: Self-pay | Admitting: Lab

## 2015-05-11 NOTE — Telephone Encounter (Signed)
-----   Message from Landis Gandy, RN sent at 05/01/2015  4:00 PM EDT ----- Regarding: Hep C no show Patient was scheduled for 8/8, no showed.  Per Dr. Linus Salmons, patient shouldn't be rescheduled until he in therapy with Leveda Anna or has completed a drug rehab program. Landis Gandy, RN

## 2015-05-11 NOTE — Telephone Encounter (Signed)
LM for patient to call me regarding missed appointment with Dr. Linus Salmons on 05/01/15-Asked him to give me a call -Patient needs appointment with Leveda Anna before proceeding with Dr Linus Salmons for treatment.

## 2015-05-23 ENCOUNTER — Other Ambulatory Visit (HOSPITAL_COMMUNITY): Payer: Self-pay | Admitting: Internal Medicine

## 2015-05-23 ENCOUNTER — Ambulatory Visit (HOSPITAL_COMMUNITY)
Admission: RE | Admit: 2015-05-23 | Discharge: 2015-05-23 | Disposition: A | Discharge status: Home / Self Care | Payer: Medicare Other | Source: Ambulatory Visit | Attending: Internal Medicine | Admitting: Internal Medicine

## 2015-05-23 ENCOUNTER — Ambulatory Visit (HOSPITAL_BASED_OUTPATIENT_CLINIC_OR_DEPARTMENT_OTHER)
Admission: RE | Admit: 2015-05-23 | Discharge: 2015-05-23 | Disposition: A | Discharge status: Home / Self Care | Payer: Medicare Other | Source: Ambulatory Visit | Attending: Internal Medicine | Admitting: Internal Medicine

## 2015-05-23 VITALS — BP 140/76 | HR 51 | Wt 187.5 lb

## 2015-05-23 DIAGNOSIS — I340 Nonrheumatic mitral (valve) insufficiency: Secondary | ICD-10-CM | POA: Insufficient documentation

## 2015-05-23 DIAGNOSIS — Z7982 Long term (current) use of aspirin: Secondary | ICD-10-CM | POA: Insufficient documentation

## 2015-05-23 DIAGNOSIS — F1410 Cocaine abuse, uncomplicated: Secondary | ICD-10-CM | POA: Diagnosis not present

## 2015-05-23 DIAGNOSIS — I129 Hypertensive chronic kidney disease with stage 1 through stage 4 chronic kidney disease, or unspecified chronic kidney disease: Secondary | ICD-10-CM | POA: Insufficient documentation

## 2015-05-23 DIAGNOSIS — N182 Chronic kidney disease, stage 2 (mild): Secondary | ICD-10-CM | POA: Diagnosis not present

## 2015-05-23 DIAGNOSIS — I2510 Atherosclerotic heart disease of native coronary artery without angina pectoris: Secondary | ICD-10-CM | POA: Diagnosis not present

## 2015-05-23 DIAGNOSIS — J449 Chronic obstructive pulmonary disease, unspecified: Secondary | ICD-10-CM | POA: Insufficient documentation

## 2015-05-23 DIAGNOSIS — F17200 Nicotine dependence, unspecified, uncomplicated: Secondary | ICD-10-CM | POA: Insufficient documentation

## 2015-05-23 DIAGNOSIS — I5022 Chronic systolic (congestive) heart failure: Secondary | ICD-10-CM

## 2015-05-23 DIAGNOSIS — Z79899 Other long term (current) drug therapy: Secondary | ICD-10-CM | POA: Diagnosis not present

## 2015-05-23 DIAGNOSIS — I428 Other cardiomyopathies: Secondary | ICD-10-CM | POA: Insufficient documentation

## 2015-05-23 DIAGNOSIS — B1920 Unspecified viral hepatitis C without hepatic coma: Secondary | ICD-10-CM | POA: Insufficient documentation

## 2015-05-23 DIAGNOSIS — I252 Old myocardial infarction: Secondary | ICD-10-CM | POA: Insufficient documentation

## 2015-05-23 DIAGNOSIS — E785 Hyperlipidemia, unspecified: Secondary | ICD-10-CM | POA: Insufficient documentation

## 2015-05-23 MED ORDER — SACUBITRIL-VALSARTAN 49-51 MG PO TABS: 1.0000 | ORAL_TABLET | Freq: Two times a day (BID) | ORAL | Status: DC

## 2015-05-23 NOTE — Progress Notes (Signed)
Patient ID: GLADYS GUTMAN, male   DOB: [birth year], 64 y.o.   MRN: [id number]  Primary Cardiologist: Dr Scarlette Calico HF: Ehren Berisha PCP: None   HPI: 64 year old male with systolic HF due to nonischemic cardiomyopathy (diagnosed 11/15) in the setting of drug abuse . He also has a history of HCV, HTN, and smoker.   Admitted 07/26/14 with increased dyspnea and chest pain. Had cath as noted below with recommendations for medical management. EF 20-25% Started on HF meds and discharged however he was unable pay for them so he did not start.     He returns for today HF follow up. Last visit we increased carvedilol to 9.375 mg BID. Has very occasional lightheadedness with standing. Denies SOB/PND/Orthopnea. Still riding bike to all appointments (3-4 miles) without any trouble. Weight at home 180-188 pounds. Up 1 lb since last visit. Drinks a beer a week. Smoking 1/4 pack per day. Says he hasn't used cocaine since July. Taking all medications.  Denies CP. Has not required lasix.      LHC 07/27/14  Moderate proximal LAD disease bifurcation stenosis. Mild to moderate distal RCA disease. Heavily calcified left coronary  ECHO 07/2014 EF 20-25%  ECHO 11/30/2014: EF 25-30% Grade I DD ECHO 05/23/15:  EF 40-45%   Labs 08/31/14 k 4.3 Creatinine 1.09 Labs 09/30/2014 K 4.8 Creatinine 1.19  Labs 11/09/2014: K 4.3 Creatinine 1.14  Labs 02/15/2015: K 4.1 Creatinine 1.22 Labs 05/05/2015: K 3.9, Creatinine 1.29  SH: Denies cocaine x 1 month. unemployed, lives with girl friend, has difficulty with transportation FH: No know CAD in his family  ROS: All systems negative except as listed in HPI, PMH and Problem List.  Past Medical History  Diagnosis Date  . COPD (chronic obstructive pulmonary disease)   . Hepatitis C     a. 07/2014 HCV AB reactive, HCV quant log: 5.7, HCV quant [id number].  . CKD (chronic kidney disease), stage II   . Fracture 1976    Bilateral forearm compound fractures  . Non-obstructive CAD     a.  07/2014 Cath: LM nl, LAD nonobs, LCX nl, OM nl, RCA 50d, EF 20-25%  . NICM (nonischemic cardiomyopathy)     a. 07/2014 Echo: EF 25-30%, diff HK, Gr 1 DD.  Marland Kitchen Accelerated hypertension   . Tobacco use disorder   . Chronic systolic CHF (congestive heart failure)     a. 07/2014 Echo: EF 25-30%, diff HK, Gr 1 DD.  . Crack cocaine use   . Noncompliance     Current Outpatient Prescriptions  Medication Sig Dispense Refill  . aspirin 81 MG EC tablet Take 1 tablet (81 mg total) by mouth daily. 30 tablet 1  . atorvastatin (LIPITOR) 20 MG tablet Take 1 tablet (20 mg total) by mouth daily. 30 tablet 1  . carvedilol (COREG) 6.25 MG tablet Take 1.5 tablets (9.375 mg total) by mouth 2 (two) times daily. 90 tablet 3  . furosemide (LASIX) 20 MG tablet Take 1 tablet (20 mg total) by mouth daily as needed. For weight gain 3 lbs or more overnight. 15 tablet 1  . losartan (COZAAR) 50 MG tablet Take 1 tablet (50 mg total) by mouth daily. 30 tablet 1  . spironolactone (ALDACTONE) 25 MG tablet Take 1 tablet (25 mg total) by mouth daily. 30 tablet 3   No current facility-administered medications for this encounter.     PHYSICAL EXAM: Filed Vitals:   05/23/15 1038  BP: 140/76  Pulse: 51  Weight: 187 lb 8 oz (85.049 kg)  SpO2: 99%    General:  Well appearing. No resp difficulty ambulated in the clinic without difficulty.  HEENT: normal Neck: supple. JVP flat. Carotids 2+ bilaterally; no bruits. No lymphadenopathy or thryomegaly appreciated. Cor: PMI normal. Regular rate & rhythm. No rubs, gallops or murmurs. No S3  Lungs: clear Abdomen: soft, nontender, nondistended. No hepatosplenomegaly. No bruits or masses. Good bowel sounds. Extremities: no cyanosis, clubbing, rash, edema Neuro: alert & orientedx3, cranial nerves grossly intact. Moves all 4 extremities w/o difficulty. Affect pleasant.   ASSESSMENT & PLAN:  1. Chronic Systolic Heart Failure, NICM 07/2014. Nonobstructive LHC in November 2015. NICM.   Had ECHO today EF ~ 40%.Marland Kitchen He is NYHA Class I. So does not meet criteria for ICD.  Volume status stable.  - Continue 40 mg po lasix as needed for 3 pound weight gain in 24 hours.  - Continue carvedilol to 9.375 mg twice a day.  No increase today with bradycardia. - Just got Medicaid. Will change Losartan to Entresto 49/51 bid - Continue 25 mg spironolactone daily .  Reinforced daily weights, low salt food choices, limiting fluid intake to < 2 liters per day.  2. Drug Abuse - going to counseling now.  Has not used cocaine in over a month. 3. Hep C - Elevated AST/ALT11/2015 HCV AB reactive, HCV quant log: 5.7, HCV quant [id number]. Has been seen by ID, not going to start on medicine right now, still going through inspections.  Liver US 04/18/15 Liver within normal limits but at risk for fibrosis.   4. HTN- Stable.  5.  Hyperlipidemia- Continue atorvastatin.  6. Nonobstructive CAD- NSTEMI 07/2014 . Had cath as above.  Continue baby aspirin and atorvastatin.  7. Current Tobacco Abuse - Continues to decrease. Encouraged to stop completely.  8. Cocaine Abuse- Started using again. Encouraged to stop.   Follow up 2 months.  Satira Mccallum Tillery PA-C 10:45 AM   Patient seen and examined with Oda Kilts, PA-C. We discussed all aspects of the encounter. I agree with the assessment and plan as stated above.   Echo reviewed personally EF around 40-45% so continue to improve. NYHA I. Volume status looks great. Change Losartan to Entresto. Labs 1 week.   Valton Schwartz,MD 11:48 AM

## 2015-05-23 NOTE — Progress Notes (Signed)
Echocardiogram 2D Echocardiogram limited has been performed.  Tresa Res 05/23/2015, 10:52 AM

## 2015-05-23 NOTE — Patient Instructions (Signed)
Stop Losartan  Start Entresto 49/51 mg Twice daily   Labs in 7-10 days  We will contact you in 2 months to schedule your next appointment.

## 2015-05-23 NOTE — Addendum Note (Signed)
Encounter addended by: Scarlette Calico, RN on: 05/23/2015 11:58 AM<BR>     Documentation filed: Medications, Patient Instructions Section, Orders

## 2015-06-02 ENCOUNTER — Inpatient Hospital Stay (HOSPITAL_COMMUNITY): Admission: RE | Admit: 2015-06-02 | Payer: Medicare Other | Source: Ambulatory Visit

## 2015-06-21 ENCOUNTER — Other Ambulatory Visit (HOSPITAL_COMMUNITY): Payer: Self-pay | Admitting: *Deleted

## 2015-06-21 DIAGNOSIS — I5022 Chronic systolic (congestive) heart failure: Secondary | ICD-10-CM

## 2015-06-21 MED ORDER — FUROSEMIDE 20 MG PO TABS: 20.0000 mg | ORAL_TABLET | Freq: Every day | ORAL | Status: DC | PRN

## 2015-06-21 MED ORDER — SPIRONOLACTONE 25 MG PO TABS: 25.0000 mg | ORAL_TABLET | Freq: Every day | ORAL | Status: DC

## 2015-06-21 MED ORDER — SACUBITRIL-VALSARTAN 49-51 MG PO TABS: 1.0000 | ORAL_TABLET | Freq: Two times a day (BID) | ORAL | Status: DC

## 2015-06-21 MED ORDER — CARVEDILOL 6.25 MG PO TABS: 9.3750 mg | ORAL_TABLET | Freq: Two times a day (BID) | ORAL | Status: DC

## 2015-06-21 MED ORDER — ASPIRIN 81 MG PO TBEC: 81.0000 mg | DELAYED_RELEASE_TABLET | Freq: Every day | ORAL | Status: DC

## 2015-06-21 MED ORDER — ATORVASTATIN CALCIUM 20 MG PO TABS: 20.0000 mg | ORAL_TABLET | Freq: Every day | ORAL | Status: DC

## 2015-06-22 ENCOUNTER — Telehealth (HOSPITAL_COMMUNITY): Payer: Self-pay | Admitting: *Deleted

## 2015-06-22 ENCOUNTER — Other Ambulatory Visit (HOSPITAL_COMMUNITY): Payer: Self-pay | Admitting: *Deleted

## 2015-06-22 DIAGNOSIS — I5022 Chronic systolic (congestive) heart failure: Secondary | ICD-10-CM

## 2015-06-22 MED ORDER — CARVEDILOL 6.25 MG PO TABS: 9.3750 mg | ORAL_TABLET | Freq: Two times a day (BID) | ORAL | Status: DC

## 2015-06-22 MED ORDER — ATORVASTATIN CALCIUM 20 MG PO TABS: 20.0000 mg | ORAL_TABLET | Freq: Every day | ORAL | Status: DC

## 2015-06-22 NOTE — Telephone Encounter (Signed)
Medication Samples have been provided to the patient.  Drug name: Alyson Reedy: 28  LOT: [id number]  Exp.Date: 10/2015  The patient has been instructed regarding the correct time, dose, and frequency of taking this medication, including desired effects and most common side effects.   Philemon Kingdom D 12:09 PM 06/22/2015

## 2015-06-22 NOTE — Telephone Encounter (Signed)
Out pt pharm called to let us that since pt has medicaid plan he is no longer eligible for HF fund Still waiting on prior auth for [facility]

## 2015-06-29 ENCOUNTER — Encounter: Payer: Self-pay | Admitting: Internal Medicine

## 2015-06-29 ENCOUNTER — Telehealth (HOSPITAL_COMMUNITY): Payer: Self-pay | Admitting: Pharmacist

## 2015-06-29 ENCOUNTER — Ambulatory Visit (INDEPENDENT_AMBULATORY_CARE_PROVIDER_SITE_OTHER): Payer: Medicare Other | Admitting: Internal Medicine

## 2015-06-29 ENCOUNTER — Ambulatory Visit: Payer: Medicare Other | Admitting: *Deleted

## 2015-06-29 VITALS — BP 123/74 | HR 67 | Temp 97.5°F | Ht 72.0 in | Wt 184.0 lb

## 2015-06-29 DIAGNOSIS — K7400 Hepatic fibrosis, unspecified: Secondary | ICD-10-CM

## 2015-06-29 DIAGNOSIS — F1910 Other psychoactive substance abuse, uncomplicated: Secondary | ICD-10-CM | POA: Diagnosis not present

## 2015-06-29 DIAGNOSIS — B182 Chronic viral hepatitis C: Secondary | ICD-10-CM | POA: Diagnosis present

## 2015-06-29 NOTE — Assessment & Plan Note (Signed)
Will defer treatment consideration until he is drug free and committed.  RTC after effort made.

## 2015-06-29 NOTE — Telephone Encounter (Signed)
Entresto approved through [employer] Part D. Outpatient pharmacy aware and verified copay of $3.60.  Ruta Hinds. Velva Harman, PharmD, BCPS, CPP Clinical Pharmacist Pager: [phone] Phone: [phone] 06/29/2015 9:18 AM

## 2015-06-29 NOTE — BH Specialist Note (Signed)
Jeffery Conrad was present today for his scheduled appointment with counselor after a couple of no shows.

## 2015-06-29 NOTE — Progress Notes (Signed)
   Subjective:    Patient ID: Jeffery Conrad, male    DOB: Nov 03, 1950, 64 y.o.   MRN: [id number]  HPI Here for hepatitis C infection.  Genotype 1a, viral load 555,221.  Elastography F2-3.  Occasional alcohol, continues to use drugs and has missed follow up appointments with substance abuse counselor.  Was referred to outpatient program but did not go.  Heart problems related to drug use.    Review of Systems  Constitutional: Negative for fatigue.  Gastrointestinal: Negative for nausea and diarrhea.  Skin: Negative for rash.  Neurological: Negative for dizziness and light-headedness.       Objective:   Physical Exam  Constitutional: He appears well-developed and well-nourished. No distress.  Eyes: No scleral icterus.  Cardiovascular: Normal rate, regular rhythm and normal heart sounds.   No murmur heard. Pulmonary/Chest: Effort normal and breath sounds normal. No respiratory distress.  Skin: No rash noted.     Social History   Social History  . Marital Status: Divorced    Spouse Name: N/A  . Number of Children: N/A  . Years of Education: N/A   Occupational History  . Not on file.   Social History Main Topics  . Smoking status: Current Every Day Smoker -- 0.50 packs/day for 40 years    Types: Cigarettes  . Smokeless tobacco: Never Used     Comment: cutting back  . Alcohol Use: 4.2 oz/week    7 Cans of beer per week     Comment: beer  . Drug Use: No     Comment: pt states last use 04/24/15  . Sexual Activity: Yes   Other Topics Concern  . Not on file   Social History Narrative       Assessment & Plan:

## 2015-06-29 NOTE — Assessment & Plan Note (Signed)
Seeing out counselor and I have ordered a UDS as needed (standing)

## 2015-06-29 NOTE — Assessment & Plan Note (Signed)
Discussed with pt.  No cirrhosis, some liver damage.

## 2015-07-18 ENCOUNTER — Ambulatory Visit (HOSPITAL_COMMUNITY)
Admission: RE | Admit: 2015-07-18 | Discharge: 2015-07-18 | Disposition: A | Discharge status: Home / Self Care | Payer: Medicare Other | Source: Ambulatory Visit | Attending: Internal Medicine | Admitting: Internal Medicine

## 2015-07-18 ENCOUNTER — Telehealth (HOSPITAL_COMMUNITY): Payer: Self-pay | Admitting: Vascular Surgery

## 2015-07-18 VITALS — BP 138/64 | HR 66 | Wt 187.5 lb

## 2015-07-18 DIAGNOSIS — N182 Chronic kidney disease, stage 2 (mild): Secondary | ICD-10-CM | POA: Insufficient documentation

## 2015-07-18 DIAGNOSIS — B1920 Unspecified viral hepatitis C without hepatic coma: Secondary | ICD-10-CM | POA: Diagnosis not present

## 2015-07-18 DIAGNOSIS — I2510 Atherosclerotic heart disease of native coronary artery without angina pectoris: Secondary | ICD-10-CM | POA: Diagnosis not present

## 2015-07-18 DIAGNOSIS — J449 Chronic obstructive pulmonary disease, unspecified: Secondary | ICD-10-CM | POA: Diagnosis not present

## 2015-07-18 DIAGNOSIS — I429 Cardiomyopathy, unspecified: Secondary | ICD-10-CM | POA: Diagnosis not present

## 2015-07-18 DIAGNOSIS — F17210 Nicotine dependence, cigarettes, uncomplicated: Secondary | ICD-10-CM | POA: Diagnosis not present

## 2015-07-18 DIAGNOSIS — I252 Old myocardial infarction: Secondary | ICD-10-CM | POA: Insufficient documentation

## 2015-07-18 DIAGNOSIS — F1410 Cocaine abuse, uncomplicated: Secondary | ICD-10-CM | POA: Insufficient documentation

## 2015-07-18 DIAGNOSIS — Z79899 Other long term (current) drug therapy: Secondary | ICD-10-CM | POA: Diagnosis not present

## 2015-07-18 DIAGNOSIS — E785 Hyperlipidemia, unspecified: Secondary | ICD-10-CM | POA: Diagnosis not present

## 2015-07-18 DIAGNOSIS — I428 Other cardiomyopathies: Secondary | ICD-10-CM

## 2015-07-18 DIAGNOSIS — I5022 Chronic systolic (congestive) heart failure: Secondary | ICD-10-CM

## 2015-07-18 DIAGNOSIS — Z7982 Long term (current) use of aspirin: Secondary | ICD-10-CM | POA: Diagnosis not present

## 2015-07-18 DIAGNOSIS — F17200 Nicotine dependence, unspecified, uncomplicated: Secondary | ICD-10-CM

## 2015-07-18 DIAGNOSIS — I130 Hypertensive heart and chronic kidney disease with heart failure and stage 1 through stage 4 chronic kidney disease, or unspecified chronic kidney disease: Secondary | ICD-10-CM | POA: Diagnosis not present

## 2015-07-18 DIAGNOSIS — I159 Secondary hypertension, unspecified: Secondary | ICD-10-CM | POA: Diagnosis not present

## 2015-07-18 MED ORDER — ATORVASTATIN CALCIUM 20 MG PO TABS: 20.0000 mg | ORAL_TABLET | Freq: Every day | ORAL | Status: DC

## 2015-07-18 MED ORDER — FUROSEMIDE 20 MG PO TABS: 20.0000 mg | ORAL_TABLET | Freq: Every day | ORAL | Status: DC | PRN

## 2015-07-18 MED ORDER — SPIRONOLACTONE 25 MG PO TABS: 25.0000 mg | ORAL_TABLET | Freq: Every day | ORAL | Status: DC

## 2015-07-18 MED ORDER — CARVEDILOL 6.25 MG PO TABS: 9.3750 mg | ORAL_TABLET | Freq: Two times a day (BID) | ORAL | Status: DC

## 2015-07-18 NOTE — Patient Instructions (Signed)
We will contact you in 3 months to schedule your next appointment.  

## 2015-07-18 NOTE — Progress Notes (Signed)
Patient ID: Jeffery Conrad, male   DOB: 02/01/1951, 64 y.o.   MRN: [id number]   Primary Cardiologist: Dr Scarlette Calico HF: Bensimhon PCP: None   HPI: 64 year old male with systolic HF due to nonischemic cardiomyopathy (diagnosed 11/15) in the setting of drug abuse . He also has a history of HCV, HTN, and smoker.   Admitted 07/26/14 with increased dyspnea and chest pain. Had cath as noted below with recommendations for medical management. EF 20-25% Started on HF meds and discharged however he was unable pay for them so he did not start.   He returns for today HF follow up. Has been out entresto for a week. Denies SOB/PND/Orthopnea. Drinks a beer a day. Smoking 1/2 pack per day. Says he hasn't used cocaine since July. Has not had meds today. Denies CP. Has not required lasix. Completing drug counseling.       LHC 07/27/14  Moderate proximal LAD disease bifurcation stenosis. Mild to moderate distal RCA disease. Heavily calcified left coronary  ECHO 07/2014 EF 20-25%  ECHO 11/30/2014: EF 25-30% Grade I DD ECHO 05/23/15:  EF 40-45%   Labs 08/31/14 k 4.3 Creatinine 1.09 Labs 09/30/2014 K 4.8 Creatinine 1.19  Labs 11/09/2014: K 4.3 Creatinine 1.14  Labs 02/15/2015: K 4.1 Creatinine 1.22 Labs 05/05/2015: K 3.9, Creatinine 1.29  SH: Denies cocaine x 1 month. unemployed, lives with girl friend, has difficulty with transportation FH: No know CAD in his family  ROS: All systems negative except as listed in HPI, PMH and Problem List.  Past Medical History  Diagnosis Date  . COPD (chronic obstructive pulmonary disease) ([place])   . Hepatitis C     a. 07/2014 HCV AB reactive, HCV quant log: 5.7, HCV quant [id number].  . CKD (chronic kidney disease), stage II   . Fracture 1976    Bilateral forearm compound fractures  . Non-obstructive CAD     a. 07/2014 Cath: LM nl, LAD nonobs, LCX nl, OM nl, RCA 50d, EF 20-25%  . NICM (nonischemic cardiomyopathy) ([place])     a. 07/2014 Echo: EF 25-30%, diff HK, Gr 1 DD.  Marland Kitchen  Accelerated hypertension   . Tobacco use disorder   . Chronic systolic CHF (congestive heart failure) ([place])     a. 07/2014 Echo: EF 25-30%, diff HK, Gr 1 DD.  . Crack cocaine use   . Noncompliance     Current Outpatient Prescriptions  Medication Sig Dispense Refill  . aspirin 81 MG EC tablet Take 1 tablet (81 mg total) by mouth daily. 30 tablet 1  . atorvastatin (LIPITOR) 20 MG tablet Take 1 tablet (20 mg total) by mouth daily. 30 tablet 1  . carvedilol (COREG) 6.25 MG tablet Take 1.5 tablets (9.375 mg total) by mouth 2 (two) times daily. 90 tablet 3  . furosemide (LASIX) 20 MG tablet Take 1 tablet (20 mg total) by mouth daily as needed. For weight gain 3 lbs or more overnight. 15 tablet 1  . sacubitril-valsartan (ENTRESTO) 49-51 MG Take 1 tablet by mouth 2 (two) times daily. 60 tablet 3  . spironolactone (ALDACTONE) 25 MG tablet Take 1 tablet (25 mg total) by mouth daily. 30 tablet 3   No current facility-administered medications for this encounter.     PHYSICAL EXAM: Filed Vitals:   07/18/15 1135  BP: 138/64  Pulse: 66  Weight: 187 lb 8 oz (85.049 kg)  SpO2: 98%    General:  Well appearing. No resp difficulty ambulated in the clinic without difficulty.  HEENT: normal Neck: supple.  JVP flat. Carotids 2+ bilaterally; no bruits. No lymphadenopathy or thryomegaly appreciated. Cor: PMI normal. Regular rate & rhythm. No rubs, gallops or murmurs. No S3  Lungs: clear Abdomen: soft, nontender, nondistended. No hepatosplenomegaly. No bruits or masses. Good bowel sounds. Extremities: no cyanosis, clubbing, rash, edema Neuro: alert & orientedx3, cranial nerves grossly intact. Moves all 4 extremities w/o difficulty. Affect pleasant.   ASSESSMENT & PLAN:  1. Chronic Systolic Heart Failure, NICM 07/2014. Nonobstructive LHC in November 2015. NICM.  Had ECHO today EF ~ 40-45%.  He is NYHA Class I.  Volume status stable.  - Continue 40 mg po lasix as needed for 3 pound weight gain in 24  hours.  - Continue carvedilol to 9.375 mg twice a day.  No increase today with bradycardia. Has not had meds today. Plans ot pick up today - Continue Entresto 49/51mg  bid. He will pick up today.  - Continue 25 mg spironolactone daily .  Reinforced daily weights, low salt food choices, limiting fluid intake to < 2 liters per day.  2. Drug Abuse - going to counseling now.  Has not used cocaine in over a month. 3. Hep C - Elevated AST/ALT11/2015 HCV AB reactive, HCV quant log: 5.7, HCV quant [id number]. Has been seen by ID, not going to start on medicine right now until he completes drug counseling.  Liver US 04/18/15 Liver within normal limits but at risk for fibrosis.  Has been seen by Dr Linus Salmons.  4. HTN- Higher than normal but has not had entresto in a week. Restart today.   5.  Hyperlipidemia- Continue atorvastatin.  6. Nonobstructive CAD- NSTEMI 07/2014 . Had cath as above.  Continue baby aspirin and atorvastatin.  7. Current Tobacco Abuse - Continues to smoke 1/2 PPD.  Encouraged to stop completely.  8. Cocaine Abuse- Completing drug counseling.     Follow up 3 months. Check BMET at that time.   Megan Hayduk NP-C  11:44 AM

## 2015-07-18 NOTE — Telephone Encounter (Signed)
Left message about the change to pt appt

## 2015-08-08 ENCOUNTER — Other Ambulatory Visit (HOSPITAL_COMMUNITY): Payer: Self-pay | Admitting: Adult Health

## 2015-08-08 ENCOUNTER — Ambulatory Visit: Payer: Medicare Other | Admitting: *Deleted

## 2015-08-08 DIAGNOSIS — F1010 Alcohol abuse, uncomplicated: Secondary | ICD-10-CM

## 2015-08-08 DIAGNOSIS — B1710 Acute hepatitis C without hepatic coma: Secondary | ICD-10-CM

## 2015-08-08 NOTE — BH Specialist Note (Signed)
Jeffery Conrad was present today for his scheduled appointment.  Client was oriented times four with good affect and dress.  Client was alert and in good spirits.  Client reported that he has not used alcohol in about a month.  Client says that as long as he is not around those who are drinking he can stay sober.  Counselor shared with client that now would be the time to recognize that the holidays are appropaching fast and with them can come triggers to use.  Counselor provided client tips for avoiding relapse during the holidays.  Client was attentive and provided feedback accordingly.  Client otherwise was doing well today.  Counselor made another appointment with client for a month out.   Rolena Infante, MA, [facility]

## 2015-08-31 ENCOUNTER — Other Ambulatory Visit (HOSPITAL_COMMUNITY): Payer: Self-pay

## 2015-08-31 ENCOUNTER — Other Ambulatory Visit: Payer: Medicare Other

## 2015-08-31 ENCOUNTER — Telehealth (HOSPITAL_COMMUNITY): Payer: Self-pay

## 2015-08-31 DIAGNOSIS — I5022 Chronic systolic (congestive) heart failure: Secondary | ICD-10-CM

## 2015-08-31 MED ORDER — SPIRONOLACTONE 25 MG PO TABS: 25.0000 mg | ORAL_TABLET | Freq: Every day | ORAL | Status: DC

## 2015-08-31 MED ORDER — FUROSEMIDE 20 MG PO TABS: 20.0000 mg | ORAL_TABLET | Freq: Every day | ORAL | Status: DC | PRN

## 2015-08-31 MED ORDER — CARVEDILOL 6.25 MG PO TABS: 9.3750 mg | ORAL_TABLET | Freq: Two times a day (BID) | ORAL | Status: DC

## 2015-08-31 MED ORDER — ASPIRIN 81 MG PO TBEC: 81.0000 mg | DELAYED_RELEASE_TABLET | Freq: Every day | ORAL | Status: DC

## 2015-08-31 MED ORDER — ATORVASTATIN CALCIUM 20 MG PO TABS: 20.0000 mg | ORAL_TABLET | Freq: Every day | ORAL | Status: DC

## 2015-08-31 MED ORDER — SACUBITRIL-VALSARTAN 49-51 MG PO TABS: 1.0000 | ORAL_TABLET | Freq: Two times a day (BID) | ORAL | Status: DC

## 2015-08-31 NOTE — Telephone Encounter (Signed)
Patient called CHF triage line to obtain refills on medications.  Sent to preferred pharmacy electronically.  Renee Pain

## 2015-09-04 ENCOUNTER — Other Ambulatory Visit: Payer: Medicare Other

## 2015-09-04 ENCOUNTER — Ambulatory Visit: Payer: Medicare Other | Admitting: *Deleted

## 2015-09-14 ENCOUNTER — Ambulatory Visit: Payer: Medicare Other | Admitting: Internal Medicine

## 2015-10-06 ENCOUNTER — Other Ambulatory Visit (HOSPITAL_COMMUNITY): Payer: Self-pay | Admitting: *Deleted

## 2015-10-06 ENCOUNTER — Telehealth (HOSPITAL_COMMUNITY): Payer: Self-pay | Admitting: Cardiology

## 2015-10-06 DIAGNOSIS — I5022 Chronic systolic (congestive) heart failure: Secondary | ICD-10-CM

## 2015-10-06 MED ORDER — SPIRONOLACTONE 25 MG PO TABS: 25.0000 mg | ORAL_TABLET | Freq: Every day | ORAL | Status: AC

## 2015-10-06 MED ORDER — CARVEDILOL 6.25 MG PO TABS: 9.3750 mg | ORAL_TABLET | Freq: Two times a day (BID) | ORAL | Status: AC

## 2015-10-06 MED ORDER — SACUBITRIL-VALSARTAN 49-51 MG PO TABS: 1.0000 | ORAL_TABLET | Freq: Two times a day (BID) | ORAL | Status: AC

## 2015-10-06 MED ORDER — SPIRONOLACTONE 25 MG PO TABS: 25.0000 mg | ORAL_TABLET | Freq: Every day | ORAL | Status: DC

## 2015-10-06 MED ORDER — ATORVASTATIN CALCIUM 20 MG PO TABS: 20.0000 mg | ORAL_TABLET | Freq: Every day | ORAL | Status: DC

## 2015-10-06 MED ORDER — FUROSEMIDE 20 MG PO TABS: 20.0000 mg | ORAL_TABLET | Freq: Every day | ORAL | Status: AC | PRN

## 2015-10-06 MED ORDER — CARVEDILOL 6.25 MG PO TABS: 9.3750 mg | ORAL_TABLET | Freq: Two times a day (BID) | ORAL | Status: DC

## 2015-10-06 MED ORDER — SACUBITRIL-VALSARTAN 49-51 MG PO TABS: 1.0000 | ORAL_TABLET | Freq: Two times a day (BID) | ORAL | Status: DC

## 2015-10-06 MED ORDER — ATORVASTATIN CALCIUM 20 MG PO TABS: 20.0000 mg | ORAL_TABLET | Freq: Every day | ORAL | Status: AC

## 2015-10-06 MED ORDER — ASPIRIN 81 MG PO TBEC: 81.0000 mg | DELAYED_RELEASE_TABLET | Freq: Every day | ORAL | Status: AC

## 2015-10-06 MED FILL — ATORVASTATIN 20 MG TABLET: 20 | 30 days supply | Qty: 30 | Fill #0

## 2015-10-06 MED FILL — SPIRONOLACTONE 25 MG TABLET: 25 | 30 days supply | Qty: 30 | Fill #0

## 2015-10-06 MED FILL — ENTRESTO 49 MG-51 MG TABLET: 49-51 | 30 days supply | Qty: 60 | Fill #0

## 2015-10-06 MED FILL — CARVEDILOL 6.25 MG TABLET: 6.25 | 30 days supply | Qty: 90 | Fill #0

## 2015-10-06 NOTE — Telephone Encounter (Signed)
PATIENT CALLED TO REQUEST REFILLS ON ALL MEDS SEND TO [facility]

## 2015-10-19 ENCOUNTER — Other Ambulatory Visit: Payer: Medicare Other

## 2015-10-23 ENCOUNTER — Other Ambulatory Visit: Payer: Medicare Other

## 2015-10-23 DIAGNOSIS — F1910 Other psychoactive substance abuse, uncomplicated: Secondary | ICD-10-CM

## 2015-10-24 LAB — DRUG SCREEN, URINE
Amphetamine Screen, Ur: NEGATIVE
BARBITURATE QUANT UR: NEGATIVE
Benzodiazepines.: NEGATIVE
COCAINE METABOLITES: POSITIVE — AB
Creatinine,U: 101.02 mg/dL
MARIJUANA METABOLITE: NEGATIVE
Methadone: NEGATIVE
OPIATES: NEGATIVE
PHENCYCLIDINE (PCP): NEGATIVE
Propoxyphene: NEGATIVE

## 2015-11-02 ENCOUNTER — Ambulatory Visit (INDEPENDENT_AMBULATORY_CARE_PROVIDER_SITE_OTHER): Payer: Medicare Other | Admitting: Internal Medicine

## 2015-11-02 ENCOUNTER — Ambulatory Visit: Payer: Medicare Other | Admitting: *Deleted

## 2015-11-02 ENCOUNTER — Encounter: Payer: Self-pay | Admitting: Internal Medicine

## 2015-11-02 VITALS — BP 127/81 | HR 67 | Temp 97.3°F | Wt 203.0 lb

## 2015-11-02 DIAGNOSIS — K7400 Hepatic fibrosis, unspecified: Secondary | ICD-10-CM

## 2015-11-02 DIAGNOSIS — Z79899 Other long term (current) drug therapy: Secondary | ICD-10-CM | POA: Diagnosis not present

## 2015-11-02 DIAGNOSIS — F1010 Alcohol abuse, uncomplicated: Secondary | ICD-10-CM

## 2015-11-02 DIAGNOSIS — F1910 Other psychoactive substance abuse, uncomplicated: Secondary | ICD-10-CM | POA: Diagnosis not present

## 2015-11-02 DIAGNOSIS — B182 Chronic viral hepatitis C: Secondary | ICD-10-CM | POA: Diagnosis not present

## 2015-11-02 MED FILL — CARVEDILOL 6.25 MG TABLET: 6.25 | 30 days supply | Qty: 90 | Fill #1

## 2015-11-02 MED FILL — ENTRESTO 49 MG-51 MG TABLET: 49-51 | 30 days supply | Qty: 60 | Fill #1

## 2015-11-02 MED FILL — SPIRONOLACTONE 25 MG TABLET: 25 | 30 days supply | Qty: 30 | Fill #1

## 2015-11-02 MED FILL — ATORVASTATIN 20 MG TABLET: 20 | 30 days supply | Qty: 30 | Fill #1

## 2015-11-02 NOTE — BH Specialist Note (Signed)
Counselor met with Jeffery Conrad per Dr. Linus Salmons request as a result of poly substance abuse.  Patient was oriented times four with nervous affect and sloppy dress.  Patient indicated immediate that his ride was outside and he needed to go.  When counselor inquired with patient as to why he had been missing his appointments, he indicated that he had been busy.  Patient was not receptive to counselor talking with him and changed the subject constantly saying that he was in a hurry and needed to get going since he was holding his ride up. Patient left and stated he would make an appointment with counselor next week.  Patient made an appointment for a month out.   Rolena Infante, MA [facility]

## 2015-11-04 NOTE — Assessment & Plan Note (Signed)
Moderate fibrosis but no cirrhosis.

## 2015-11-04 NOTE — Assessment & Plan Note (Signed)
No indication for treatment at this time.  With ongoing drug use, his life expectancy from his heart failure is much shorter than any risk from liver disease.  Therefore will defer treatment until he is remains drug free.  I will order standing UDS screens.

## 2015-11-04 NOTE — Assessment & Plan Note (Signed)
He will need to continue with substance abuse counseling for several months and have drug screening.

## 2015-11-04 NOTE — Progress Notes (Signed)
   Subjective:    Patient ID: Jeffery Conrad, male    DOB: 1950-09-25, 65 y.o.   MRN: [id number]  HPI Here for hepatitis C infection.   Genotype 1a, viral load 555,221.  Elastography F2-3.  Occasional alcohol, continues to use drugs and has missed follow up appointments with substance abuse counselor again.  Heart problems related to drug use.  Similar situation in October.  No associated symptoms.  Has cocaine positive drug screen from January 30th.    Review of Systems  Constitutional: Negative for fatigue.  Gastrointestinal: Negative for nausea and diarrhea.  Skin: Negative for rash.  Neurological: Negative for dizziness and light-headedness.       Objective:   Physical Exam  Constitutional: He appears well-developed and well-nourished. No distress.  Eyes: No scleral icterus.  Cardiovascular: Normal rate, regular rhythm and normal heart sounds.   No murmur heard. Pulmonary/Chest: Effort normal and breath sounds normal. No respiratory distress.  Skin: No rash noted.     SHx: initially denies use but UDS confirms ongoing use     Assessment & Plan:

## 2015-11-29 ENCOUNTER — Ambulatory Visit: Payer: Medicare Other | Admitting: *Deleted

## 2015-12-11 ENCOUNTER — Ambulatory Visit: Payer: Medicare Other | Admitting: *Deleted

## 2015-12-11 DIAGNOSIS — F1010 Alcohol abuse, uncomplicated: Secondary | ICD-10-CM

## 2015-12-11 DIAGNOSIS — F1410 Cocaine abuse, uncomplicated: Secondary | ICD-10-CM

## 2015-12-11 NOTE — BH Specialist Note (Signed)
Jeffery Conrad was present for his scheduled appointment.  Patient was oriented times four with good affect but soiled dress.  Patient was alert but nervous as evidenced by expressions on face and rushing through the appointment.  Patient shared that he was doing ok but not getting a long with his girlfriend.  Patient admitted that he had used cocaine about a month ago and usually drinks a couple of 40's a week. Counselor encouraged patient to consider outpatient services to more intensely address his polysubstance abuse.  Patient indicated he did not feel that it was necessary as his use is very sporadic and he does not consider it a habit. Counselor educated patient about denial and how if affects substance abuse. Counselor provided support and encouragement accordingly. Counselor recommended that patient seriously think about getting treatment outside of [facility].  Patient stated he would indeed think about it.   Rolena Infante, MA [facility]

## 2016-01-24 ENCOUNTER — Ambulatory Visit: Payer: Medicare Other | Admitting: *Deleted

## 2016-03-11 ENCOUNTER — Ambulatory Visit: Payer: Medicare Other | Admitting: Internal Medicine

## 2016-03-12 ENCOUNTER — Telehealth: Payer: Self-pay | Admitting: Lab

## 2016-03-12 NOTE — Telephone Encounter (Signed)
Pt left me a message yesterday regarding appmt with Dr. Linus Salmons.  He need to reschedule because his father died.  I returned his call today and I left a message to call me back to reschedule his appmt, with his wife along with our condolences.

## 2016-04-25 ENCOUNTER — Ambulatory Visit: Payer: Medicare Other | Admitting: Internal Medicine

## 2016-05-01 MED FILL — SPIRONOLACTONE 25 MG TABLET: 25 | 30 days supply | Qty: 30 | Fill #2

## 2016-05-01 MED FILL — ENTRESTO 49 MG-51 MG TABLET: 49-51 | 30 days supply | Qty: 60 | Fill #2

## 2016-05-01 MED FILL — CARVEDILOL 6.25 MG TABLET: 6.25 | 30 days supply | Qty: 90 | Fill #2

## 2016-05-01 MED FILL — ATORVASTATIN 20 MG TABLET: 20 | 30 days supply | Qty: 30 | Fill #2

## 2016-06-25 MED FILL — CARVEDILOL 6.25 MG TABLET: 6.25 | 30 days supply | Qty: 90 | Fill #3

## 2016-06-25 MED FILL — ATORVASTATIN 20 MG TABLET: 20 | 30 days supply | Qty: 30 | Fill #3

## 2016-06-25 MED FILL — SPIRONOLACTONE 25 MG TABLET: 25 | 30 days supply | Qty: 30 | Fill #3

## 2016-06-25 MED FILL — ENTRESTO 49 MG-51 MG TABLET: 49-51 | 30 days supply | Qty: 60 | Fill #3

## 2016-09-07 IMAGING — CR DG CHEST 1V PORT
1 series · 1 of 1 positions shown · non-contrast
Comparison: None.

CLINICAL DATA: Hypoxia.

EXAM:
PORTABLE CHEST - 1 VIEW

[AP]
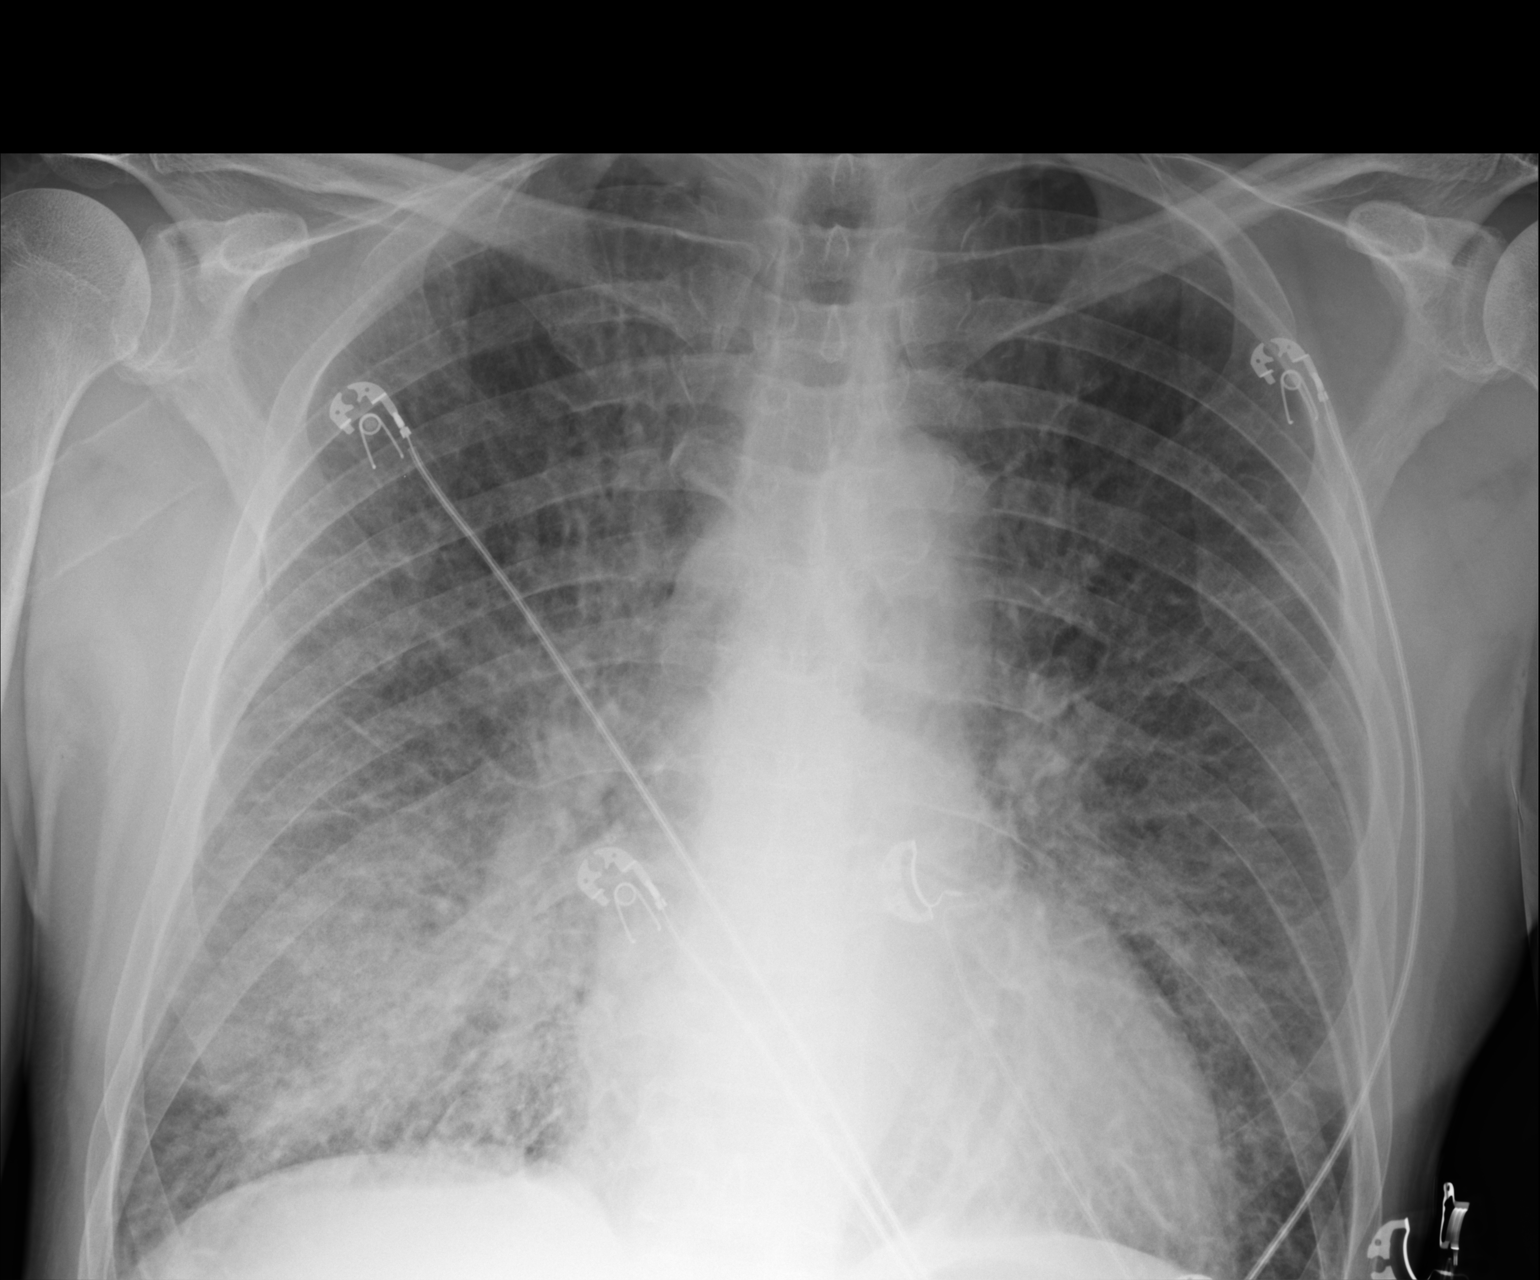

[1 of 1 positions shown; findings below may reference images not displayed]

FINDINGS: Heart size is normal. Pulmonary vascularity appears normal. Diffuse
interstitial and airspace disease in the lungs suggesting edema
versus diffuse pneumonia. No blunting of visualize costophrenic
angles. No pneumothorax.
IMPRESSION: Diffuse interstitial and airspace disease in the lungs suggesting
edema versus pneumonia.

## 2016-11-13 ENCOUNTER — Inpatient Hospital Stay (HOSPITAL_COMMUNITY)
Admission: EM | Admit: 2016-11-13 | Discharge: 2016-12-22 | DRG: 308 | Disposition: E | Payer: Medicare Other | Attending: Internal Medicine | Admitting: Internal Medicine

## 2016-11-13 ENCOUNTER — Emergency Department (HOSPITAL_COMMUNITY): Payer: Medicare Other

## 2016-11-13 ENCOUNTER — Inpatient Hospital Stay (HOSPITAL_COMMUNITY): Payer: Medicare Other

## 2016-11-13 DIAGNOSIS — I5022 Chronic systolic (congestive) heart failure: Secondary | ICD-10-CM | POA: Diagnosis present

## 2016-11-13 DIAGNOSIS — G931 Anoxic brain damage, not elsewhere classified: Secondary | ICD-10-CM | POA: Diagnosis present

## 2016-11-13 DIAGNOSIS — J690 Pneumonitis due to inhalation of food and vomit: Secondary | ICD-10-CM | POA: Diagnosis present

## 2016-11-13 DIAGNOSIS — E873 Alkalosis: Secondary | ICD-10-CM | POA: Diagnosis not present

## 2016-11-13 DIAGNOSIS — N170 Acute kidney failure with tubular necrosis: Secondary | ICD-10-CM | POA: Diagnosis present

## 2016-11-13 DIAGNOSIS — E2740 Unspecified adrenocortical insufficiency: Secondary | ICD-10-CM | POA: Diagnosis not present

## 2016-11-13 DIAGNOSIS — R230 Cyanosis: Secondary | ICD-10-CM | POA: Diagnosis present

## 2016-11-13 DIAGNOSIS — Z452 Encounter for adjustment and management of vascular access device: Secondary | ICD-10-CM

## 2016-11-13 DIAGNOSIS — F1010 Alcohol abuse, uncomplicated: Secondary | ICD-10-CM | POA: Diagnosis present

## 2016-11-13 DIAGNOSIS — F17210 Nicotine dependence, cigarettes, uncomplicated: Secondary | ICD-10-CM | POA: Diagnosis present

## 2016-11-13 DIAGNOSIS — G9340 Encephalopathy, unspecified: Secondary | ICD-10-CM

## 2016-11-13 DIAGNOSIS — N184 Chronic kidney disease, stage 4 (severe): Secondary | ICD-10-CM | POA: Diagnosis present

## 2016-11-13 DIAGNOSIS — I482 Chronic atrial fibrillation: Secondary | ICD-10-CM | POA: Diagnosis not present

## 2016-11-13 DIAGNOSIS — I480 Paroxysmal atrial fibrillation: Secondary | ICD-10-CM | POA: Diagnosis present

## 2016-11-13 DIAGNOSIS — R001 Bradycardia, unspecified: Secondary | ICD-10-CM | POA: Diagnosis present

## 2016-11-13 DIAGNOSIS — J9601 Acute respiratory failure with hypoxia: Secondary | ICD-10-CM

## 2016-11-13 DIAGNOSIS — Z969 Presence of functional implant, unspecified: Secondary | ICD-10-CM | POA: Diagnosis not present

## 2016-11-13 DIAGNOSIS — I130 Hypertensive heart and chronic kidney disease with heart failure and stage 1 through stage 4 chronic kidney disease, or unspecified chronic kidney disease: Secondary | ICD-10-CM | POA: Diagnosis present

## 2016-11-13 DIAGNOSIS — Z4682 Encounter for fitting and adjustment of non-vascular catheter: Secondary | ICD-10-CM | POA: Diagnosis not present

## 2016-11-13 DIAGNOSIS — F1410 Cocaine abuse, uncomplicated: Secondary | ICD-10-CM | POA: Diagnosis present

## 2016-11-13 DIAGNOSIS — J9600 Acute respiratory failure, unspecified whether with hypoxia or hypercapnia: Secondary | ICD-10-CM

## 2016-11-13 DIAGNOSIS — I472 Ventricular tachycardia: Secondary | ICD-10-CM | POA: Diagnosis present

## 2016-11-13 DIAGNOSIS — Z515 Encounter for palliative care: Secondary | ICD-10-CM | POA: Diagnosis not present

## 2016-11-13 DIAGNOSIS — Z66 Do not resuscitate: Secondary | ICD-10-CM | POA: Diagnosis present

## 2016-11-13 DIAGNOSIS — R0602 Shortness of breath: Secondary | ICD-10-CM | POA: Diagnosis not present

## 2016-11-13 DIAGNOSIS — S50851A Superficial foreign body of right forearm, initial encounter: Secondary | ICD-10-CM | POA: Diagnosis not present

## 2016-11-13 DIAGNOSIS — Z7982 Long term (current) use of aspirin: Secondary | ICD-10-CM

## 2016-11-13 DIAGNOSIS — I481 Persistent atrial fibrillation: Secondary | ICD-10-CM | POA: Diagnosis not present

## 2016-11-13 DIAGNOSIS — I517 Cardiomegaly: Secondary | ICD-10-CM | POA: Diagnosis not present

## 2016-11-13 DIAGNOSIS — I4901 Ventricular fibrillation: Secondary | ICD-10-CM | POA: Diagnosis not present

## 2016-11-13 DIAGNOSIS — Z9911 Dependence on respirator [ventilator] status: Secondary | ICD-10-CM | POA: Diagnosis not present

## 2016-11-13 DIAGNOSIS — Z9114 Patient's other noncompliance with medication regimen: Secondary | ICD-10-CM

## 2016-11-13 DIAGNOSIS — I429 Cardiomyopathy, unspecified: Secondary | ICD-10-CM | POA: Diagnosis not present

## 2016-11-13 DIAGNOSIS — Z9889 Other specified postprocedural states: Secondary | ICD-10-CM

## 2016-11-13 DIAGNOSIS — I447 Left bundle-branch block, unspecified: Secondary | ICD-10-CM | POA: Diagnosis present

## 2016-11-13 DIAGNOSIS — Z4659 Encounter for fitting and adjustment of other gastrointestinal appliance and device: Secondary | ICD-10-CM

## 2016-11-13 DIAGNOSIS — T884XXD Failed or difficult intubation, subsequent encounter: Secondary | ICD-10-CM

## 2016-11-13 DIAGNOSIS — K740 Hepatic fibrosis: Secondary | ICD-10-CM | POA: Diagnosis present

## 2016-11-13 DIAGNOSIS — B182 Chronic viral hepatitis C: Secondary | ICD-10-CM | POA: Diagnosis present

## 2016-11-13 DIAGNOSIS — Z7189 Other specified counseling: Secondary | ICD-10-CM | POA: Diagnosis not present

## 2016-11-13 DIAGNOSIS — J9690 Respiratory failure, unspecified, unspecified whether with hypoxia or hypercapnia: Secondary | ICD-10-CM | POA: Diagnosis not present

## 2016-11-13 DIAGNOSIS — I255 Ischemic cardiomyopathy: Secondary | ICD-10-CM | POA: Diagnosis not present

## 2016-11-13 DIAGNOSIS — D696 Thrombocytopenia, unspecified: Secondary | ICD-10-CM | POA: Diagnosis present

## 2016-11-13 DIAGNOSIS — J449 Chronic obstructive pulmonary disease, unspecified: Secondary | ICD-10-CM | POA: Diagnosis present

## 2016-11-13 DIAGNOSIS — Z79899 Other long term (current) drug therapy: Secondary | ICD-10-CM | POA: Diagnosis not present

## 2016-11-13 DIAGNOSIS — Z01818 Encounter for other preprocedural examination: Secondary | ICD-10-CM

## 2016-11-13 DIAGNOSIS — I420 Dilated cardiomyopathy: Secondary | ICD-10-CM | POA: Diagnosis present

## 2016-11-13 DIAGNOSIS — Z9861 Coronary angioplasty status: Secondary | ICD-10-CM

## 2016-11-13 DIAGNOSIS — I509 Heart failure, unspecified: Secondary | ICD-10-CM | POA: Diagnosis not present

## 2016-11-13 DIAGNOSIS — Z9119 Patient's noncompliance with other medical treatment and regimen: Secondary | ICD-10-CM

## 2016-11-13 DIAGNOSIS — J90 Pleural effusion, not elsewhere classified: Secondary | ICD-10-CM | POA: Diagnosis not present

## 2016-11-13 DIAGNOSIS — I2510 Atherosclerotic heart disease of native coronary artery without angina pectoris: Secondary | ICD-10-CM | POA: Diagnosis present

## 2016-11-13 DIAGNOSIS — Z9289 Personal history of other medical treatment: Secondary | ICD-10-CM

## 2016-11-13 DIAGNOSIS — R55 Syncope and collapse: Secondary | ICD-10-CM | POA: Diagnosis not present

## 2016-11-13 DIAGNOSIS — I469 Cardiac arrest, cause unspecified: Secondary | ICD-10-CM | POA: Diagnosis not present

## 2016-11-13 LAB — GLUCOSE, CAPILLARY
GLUCOSE-CAPILLARY: 143 mg/dL — AB (ref 65–99)
GLUCOSE-CAPILLARY: 153 mg/dL — AB (ref 65–99)
GLUCOSE-CAPILLARY: 154 mg/dL — AB (ref 65–99)
GLUCOSE-CAPILLARY: 136 mg/dL — AB (ref 65–99)
Glucose-Capillary: 147 mg/dL — ABNORMAL HIGH (ref 65–99)
Glucose-Capillary: 141 mg/dL — ABNORMAL HIGH (ref 65–99)

## 2016-11-13 LAB — TROPONIN I
TROPONIN I: 0.89 ng/mL — AB (ref <0.03)
Troponin I: 0.16 ng/mL (ref <0.03)

## 2016-11-13 LAB — PROTIME-INR
INR: 1.15
INR: 1.22
INR: 1.07
Prothrombin Time: 14.8 seconds (ref 11.4–15.2)
Prothrombin Time: 15.5 seconds — ABNORMAL HIGH (ref 11.4–15.2)
Prothrombin Time: 14 seconds (ref 11.4–15.2)

## 2016-11-13 LAB — URINALYSIS, ROUTINE W REFLEX MICROSCOPIC
Bacteria, UA: NONE SEEN
Bilirubin Urine: NEGATIVE
Glucose, UA: 50 mg/dL — AB
Ketones, ur: NEGATIVE mg/dL
LEUKOCYTES UA: NEGATIVE
NITRITE: NEGATIVE
PH: 5 (ref 5.0–8.0)
PROTEIN: 100 mg/dL — AB
Specific Gravity, Urine: 1.021 (ref 1.005–1.030)
Squamous Epithelial / LPF: NONE SEEN

## 2016-11-13 LAB — DIFFERENTIAL
Basophils Absolute: 0 10*3/uL (ref 0.0–0.1)
Basophils Relative: 0 %
Eosinophils Absolute: 0.1 10*3/uL (ref 0.0–0.7)
Eosinophils Relative: 1 %
LYMPHS ABS: 5 10*3/uL — AB (ref 0.7–4.0)
LYMPHS PCT: 41 %
Monocytes Absolute: 0.9 10*3/uL (ref 0.1–1.0)
Monocytes Relative: 7 %
NEUTROS ABS: 6.4 10*3/uL (ref 1.7–7.7)
NEUTROS PCT: 51 %

## 2016-11-13 LAB — POCT I-STAT, CHEM 8
BUN: 11 mg/dL (ref 6–20)
BUN: 12 mg/dL (ref 6–20)
BUN: 12 mg/dL (ref 6–20)
CALCIUM ION: 1.09 mmol/L — AB (ref 1.15–1.40)
CALCIUM ION: 1.09 mmol/L — AB (ref 1.15–1.40)
CALCIUM ION: 1.06 mmol/L — AB (ref 1.15–1.40)
CHLORIDE: 105 mmol/L (ref 101–111)
CREATININE: 1.5 mg/dL — AB (ref 0.61–1.24)
Chloride: 106 mmol/L (ref 101–111)
Chloride: 105 mmol/L (ref 101–111)
Creatinine, Ser: 1.5 mg/dL — ABNORMAL HIGH (ref 0.61–1.24)
Creatinine, Ser: 1.6 mg/dL — ABNORMAL HIGH (ref 0.61–1.24)
GLUCOSE: 152 mg/dL — AB (ref 65–99)
GLUCOSE: 154 mg/dL — AB (ref 65–99)
Glucose, Bld: 162 mg/dL — ABNORMAL HIGH (ref 65–99)
HCT: 48 % (ref 39.0–52.0)
HCT: 47 % (ref 39.0–52.0)
HCT: 46 % (ref 39.0–52.0)
Hemoglobin: 16.3 g/dL (ref 13.0–17.0)
Hemoglobin: 16 g/dL (ref 13.0–17.0)
Hemoglobin: 15.6 g/dL (ref 13.0–17.0)
POTASSIUM: 4.9 mmol/L (ref 3.5–5.1)
Potassium: 4.8 mmol/L (ref 3.5–5.1)
Potassium: 5.1 mmol/L (ref 3.5–5.1)
Sodium: 138 mmol/L (ref 135–145)
Sodium: 137 mmol/L (ref 135–145)
Sodium: 137 mmol/L (ref 135–145)
TCO2: 19 mmol/L (ref 0–100)
TCO2: 21 mmol/L (ref 0–100)
TCO2: 20 mmol/L (ref 0–100)

## 2016-11-13 LAB — COMPREHENSIVE METABOLIC PANEL
ALBUMIN: 3.2 g/dL — AB (ref 3.5–5.0)
ALK PHOS: 63 U/L (ref 38–126)
ALT: 60 U/L (ref 17–63)
AST: 66 U/L — AB (ref 15–41)
Anion gap: 18 — ABNORMAL HIGH (ref 5–15)
BILIRUBIN TOTAL: 1.2 mg/dL (ref 0.3–1.2)
BUN: 8 mg/dL (ref 6–20)
CALCIUM: 8.4 mg/dL — AB (ref 8.9–10.3)
CO2: 14 mmol/L — ABNORMAL LOW (ref 22–32)
CREATININE: 1.91 mg/dL — AB (ref 0.61–1.24)
Chloride: 103 mmol/L (ref 101–111)
GFR calc Af Amer: 40 mL/min — ABNORMAL LOW (ref >60)
GFR calc non Af Amer: 34 mL/min — ABNORMAL LOW (ref >60)
GLUCOSE: 190 mg/dL — AB (ref 65–99)
Potassium: 3.7 mmol/L (ref 3.5–5.1)
Sodium: 135 mmol/L (ref 135–145)
TOTAL PROTEIN: 7.3 g/dL (ref 6.5–8.1)

## 2016-11-13 LAB — I-STAT CHEM 8, ED
BUN: 8 mg/dL (ref 6–20)
CALCIUM ION: 0.98 mmol/L — AB (ref 1.15–1.40)
CREATININE: 1.7 mg/dL — AB (ref 0.61–1.24)
Chloride: 103 mmol/L (ref 101–111)
Glucose, Bld: 191 mg/dL — ABNORMAL HIGH (ref 65–99)
HEMATOCRIT: 48 % (ref 39.0–52.0)
Hemoglobin: 16.3 g/dL (ref 13.0–17.0)
Potassium: 3.7 mmol/L (ref 3.5–5.1)
Sodium: 135 mmol/L (ref 135–145)
TCO2: 18 mmol/L (ref 0–100)

## 2016-11-13 LAB — CBC
HCT: 45.9 % (ref 39.0–52.0)
HEMOGLOBIN: 16 g/dL (ref 13.0–17.0)
MCH: 30.5 pg (ref 26.0–34.0)
MCHC: 34.9 g/dL (ref 30.0–36.0)
MCV: 87.6 fL (ref 78.0–100.0)
Platelets: 152 10*3/uL (ref 150–400)
RBC: 5.24 MIL/uL (ref 4.22–5.81)
RDW: 12.4 % (ref 11.5–15.5)
WBC: 12.3 10*3/uL — ABNORMAL HIGH (ref 4.0–10.5)

## 2016-11-13 LAB — BASIC METABOLIC PANEL
Anion gap: 6 (ref 5–15)
BUN: 7 mg/dL (ref 6–20)
CO2: 14 mmol/L — AB (ref 22–32)
Calcium: 5.4 mg/dL — CL (ref 8.9–10.3)
Chloride: 119 mmol/L — ABNORMAL HIGH (ref 101–111)
Creatinine, Ser: 1.18 mg/dL (ref 0.61–1.24)
GFR calc Af Amer: >60 mL/min (ref >60)
GLUCOSE: 141 mg/dL — AB (ref 65–99)
POTASSIUM: 3.2 mmol/L — AB (ref 3.5–5.1)
Sodium: 139 mmol/L (ref 135–145)

## 2016-11-13 LAB — LACTIC ACID, PLASMA
LACTIC ACID, VENOUS: 1.3 mmol/L (ref 0.5–1.9)
LACTIC ACID, VENOUS: 1.2 mmol/L (ref 0.5–1.9)

## 2016-11-13 LAB — I-STAT ARTERIAL BLOOD GAS, ED
ACID-BASE DEFICIT: 6 mmol/L — AB (ref 0.0–2.0)
BICARBONATE: 20.9 mmol/L (ref 20.0–28.0)
O2 Saturation: 100 %
PH ART: 7.261 — AB (ref 7.350–7.450)
PO2 ART: 420 mmHg — AB (ref 83.0–108.0)
Patient temperature: 98.6
TCO2: 22 mmol/L (ref 0–100)
pCO2 arterial: 46.4 mmHg (ref 32.0–48.0)

## 2016-11-13 LAB — RAPID URINE DRUG SCREEN, HOSP PERFORMED
AMPHETAMINES: NOT DETECTED
BARBITURATES: NOT DETECTED
Benzodiazepines: POSITIVE — AB
Cocaine: POSITIVE — AB
Opiates: NOT DETECTED
TETRAHYDROCANNABINOL: NOT DETECTED

## 2016-11-13 LAB — APTT
APTT: 40 s — AB (ref 24–36)
aPTT: 40 seconds — ABNORMAL HIGH (ref 24–36)
aPTT: 37 seconds — ABNORMAL HIGH (ref 24–36)

## 2016-11-13 LAB — PROCALCITONIN: Procalcitonin: <0.1 ng/mL

## 2016-11-13 LAB — I-STAT TROPONIN, ED: Troponin i, poc: 0.03 ng/mL (ref 0.00–0.08)

## 2016-11-13 LAB — I-STAT CG4 LACTIC ACID, ED: LACTIC ACID, VENOUS: 8.17 mmol/L — AB (ref 0.5–1.9)

## 2016-11-13 LAB — MRSA PCR SCREENING: MRSA by PCR: NEGATIVE

## 2016-11-13 LAB — TRIGLYCERIDES: Triglycerides: 87 mg/dL (ref <150)

## 2016-11-13 LAB — ETHANOL: Alcohol, Ethyl (B): <5 mg/dL (ref <5)

## 2016-11-13 MED ORDER — PROPOFOL 1000 MG/100ML IV EMUL
25.0000 ug/kg/min | INTRAVENOUS | Status: DC
  Administered 2016-11-13: 25 ug/kg/min via INTRAVENOUS
  Administered 2016-11-13: 40 ug/kg/min via INTRAVENOUS
  Administered 2016-11-14 (×3): 50 ug/kg/min via INTRAVENOUS
  Filled 2016-11-13 (×4): qty 100

## 2016-11-13 MED ORDER — PIPERACILLIN-TAZOBACTAM 3.375 G IVPB 30 MIN
3.3750 g | Freq: Once | INTRAVENOUS | Status: AC
  Administered 2016-11-13: 3.375 g via INTRAVENOUS
  Filled 2016-11-13: qty 50

## 2016-11-13 MED ORDER — ETOMIDATE 2 MG/ML IV SOLN
INTRAVENOUS | Status: DC | PRN
  Administered 2016-11-13: 10 mg via INTRAVENOUS

## 2016-11-13 MED ORDER — CHLORHEXIDINE GLUCONATE 0.12% ORAL RINSE (MEDLINE KIT)
15.0000 mL | Freq: Two times a day (BID) | OROMUCOSAL | Status: DC
  Administered 2016-11-13 – 2016-11-21 (×17): 15 mL via OROMUCOSAL

## 2016-11-13 MED ORDER — PANTOPRAZOLE SODIUM 40 MG IV SOLR
40.0000 mg | Freq: Every day | INTRAVENOUS | Status: DC
  Administered 2016-11-13 – 2016-11-17 (×5): 40 mg via INTRAVENOUS
  Filled 2016-11-13 (×5): qty 40

## 2016-11-13 MED ORDER — FENTANYL BOLUS VIA INFUSION
25.0000 ug | INTRAVENOUS | Status: DC | PRN
  Filled 2016-11-13: qty 25

## 2016-11-13 MED ORDER — CHLORHEXIDINE GLUCONATE CLOTH 2 % EX PADS: 6.0000 | MEDICATED_PAD | Freq: Every day | CUTANEOUS | Status: DC

## 2016-11-13 MED ORDER — ASPIRIN 300 MG RE SUPP
300.0000 mg | Freq: Once | RECTAL | Status: AC
  Administered 2016-11-13: 300 mg via RECTAL

## 2016-11-13 MED ORDER — FENTANYL BOLUS VIA INFUSION
25.0000 ug | INTRAVENOUS | Status: DC | PRN
  Administered 2016-11-15 (×3): 25 ug via INTRAVENOUS
  Filled 2016-11-13: qty 25

## 2016-11-13 MED ORDER — CISATRACURIUM BOLUS VIA INFUSION
0.0500 mg/kg | INTRAVENOUS | Status: DC | PRN
  Filled 2016-11-13: qty 5

## 2016-11-13 MED ORDER — VANCOMYCIN HCL 10 G IV SOLR
2000.0000 mg | Freq: Once | INTRAVENOUS | Status: AC
  Administered 2016-11-13: 2000 mg via INTRAVENOUS
  Filled 2016-11-13: qty 2000

## 2016-11-13 MED ORDER — SODIUM CHLORIDE 0.9 % IV SOLN
100.0000 ug/h | INTRAVENOUS | Status: DC
  Administered 2016-11-13 – 2016-11-15 (×4): 200 ug/h via INTRAVENOUS
  Administered 2016-11-16: 125 ug/h via INTRAVENOUS
  Administered 2016-11-17 – 2016-11-18 (×2): 250 ug/h via INTRAVENOUS
  Filled 2016-11-13 (×9): qty 50

## 2016-11-13 MED ORDER — PROPOFOL 1000 MG/100ML IV EMUL
5.0000 ug/kg/min | INTRAVENOUS | Status: DC
  Administered 2016-11-13: 20 ug/kg/min via INTRAVENOUS

## 2016-11-13 MED ORDER — AMIODARONE HCL IN DEXTROSE 360-4.14 MG/200ML-% IV SOLN
60.0000 mg/h | INTRAVENOUS | Status: AC
  Filled 2016-11-13 (×2): qty 200

## 2016-11-13 MED ORDER — PROPOFOL 1000 MG/100ML IV EMUL
INTRAVENOUS | Status: AC
Start: 2016-11-13 — End: 2016-11-13
  Filled 2016-11-13: qty 100

## 2016-11-13 MED ORDER — ARTIFICIAL TEARS OP OINT
1.0000 "application " | TOPICAL_OINTMENT | Freq: Three times a day (TID) | OPHTHALMIC | Status: DC
  Administered 2016-11-13 – 2016-11-15 (×6): 1 via OPHTHALMIC
  Filled 2016-11-13: qty 3.5

## 2016-11-13 MED ORDER — NOREPINEPHRINE BITARTRATE 1 MG/ML IV SOLN
0.0000 ug/min | INTRAVENOUS | Status: DC
  Administered 2016-11-13: 5 ug/min via INTRAVENOUS
  Administered 2016-11-13: 7.5 ug/min via INTRAVENOUS
  Administered 2016-11-14: 8 ug/min via INTRAVENOUS
  Filled 2016-11-13 (×4): qty 4

## 2016-11-13 MED ORDER — SODIUM CHLORIDE 0.9% FLUSH
10.0000 mL | Freq: Two times a day (BID) | INTRAVENOUS | Status: DC
  Administered 2016-11-13 – 2016-11-22 (×18): 10 mL

## 2016-11-13 MED ORDER — SODIUM CHLORIDE 0.9 % IV SOLN
INTRAVENOUS | Status: DC
  Administered 2016-11-13 – 2016-11-15 (×3): via INTRAVENOUS

## 2016-11-13 MED ORDER — CISATRACURIUM BOLUS VIA INFUSION
0.1000 mg/kg | Freq: Once | INTRAVENOUS | Status: AC
  Administered 2016-11-13: 9.2 mg via INTRAVENOUS
  Filled 2016-11-13: qty 10

## 2016-11-13 MED ORDER — SODIUM CHLORIDE 0.9 % IV SOLN
25.0000 ug/h | INTRAVENOUS | Status: DC
  Filled 2016-11-13: qty 50

## 2016-11-13 MED ORDER — PIPERACILLIN-TAZOBACTAM 3.375 G IVPB
3.3750 g | Freq: Three times a day (TID) | INTRAVENOUS | Status: DC
  Administered 2016-11-13 – 2016-11-20 (×20): 3.375 g via INTRAVENOUS
  Filled 2016-11-13 (×23): qty 50

## 2016-11-13 MED ORDER — FENTANYL CITRATE (PF) 100 MCG/2ML IJ SOLN: 50.0000 ug | Freq: Once | INTRAMUSCULAR | Status: DC

## 2016-11-13 MED ORDER — MIDAZOLAM HCL 2 MG/2ML IJ SOLN
2.0000 mg | Freq: Once | INTRAMUSCULAR | Status: AC
  Administered 2016-11-13: 2 mg via INTRAVENOUS
  Filled 2016-11-13: qty 2

## 2016-11-13 MED ORDER — CISATRACURIUM BESYLATE (PF) 200 MG/20ML IV SOLN
1.0000 ug/kg/min | INTRAVENOUS | Status: DC
  Administered 2016-11-13 – 2016-11-14 (×2): 1 ug/kg/min via INTRAVENOUS
  Filled 2016-11-13 (×2): qty 20

## 2016-11-13 MED ORDER — AMIODARONE HCL IN DEXTROSE 360-4.14 MG/200ML-% IV SOLN: 30.0000 mg/h | INTRAVENOUS | Status: DC

## 2016-11-13 MED ORDER — ORAL CARE MOUTH RINSE
15.0000 mL | OROMUCOSAL | Status: DC
  Administered 2016-11-13 – 2016-11-22 (×85): 15 mL via OROMUCOSAL

## 2016-11-13 MED ORDER — AMIODARONE IV BOLUS ONLY 150 MG/100ML
150.0000 mg | Freq: Once | INTRAVENOUS | Status: AC
  Administered 2016-11-13: 150 mg via INTRAVENOUS

## 2016-11-13 MED ORDER — INSULIN ASPART 100 UNIT/ML ~~LOC~~ SOLN
2.0000 [IU] | SUBCUTANEOUS | Status: DC
  Administered 2016-11-13 (×2): 4 [IU] via SUBCUTANEOUS
  Administered 2016-11-14 – 2016-11-18 (×9): 2 [IU] via SUBCUTANEOUS
  Administered 2016-11-18: 4 [IU] via SUBCUTANEOUS
  Administered 2016-11-19 (×2): 2 [IU] via SUBCUTANEOUS
  Administered 2016-11-19: 4 [IU] via SUBCUTANEOUS
  Administered 2016-11-20 (×2): 2 [IU] via SUBCUTANEOUS
  Administered 2016-11-20: 4 [IU] via SUBCUTANEOUS
  Administered 2016-11-20 – 2016-11-21 (×3): 2 [IU] via SUBCUTANEOUS
  Administered 2016-11-21: 4 [IU] via SUBCUTANEOUS
  Administered 2016-11-21 – 2016-11-22 (×3): 2 [IU] via SUBCUTANEOUS

## 2016-11-13 MED ORDER — SUCCINYLCHOLINE CHLORIDE 20 MG/ML IJ SOLN
INTRAMUSCULAR | Status: DC | PRN
  Administered 2016-11-13: 100 mg via INTRAVENOUS

## 2016-11-13 MED ORDER — SODIUM CHLORIDE 0.9% FLUSH: 10.0000 mL | INTRAVENOUS | Status: DC | PRN

## 2016-11-13 MED ORDER — VANCOMYCIN HCL IN DEXTROSE 750-5 MG/150ML-% IV SOLN
750.0000 mg | Freq: Two times a day (BID) | INTRAVENOUS | Status: DC
  Administered 2016-11-14 – 2016-11-16 (×5): 750 mg via INTRAVENOUS
  Filled 2016-11-13 (×5): qty 150

## 2016-11-13 NOTE — Procedures (Signed)
Arterial Catheter Insertion Procedure Note Jeffery Conrad [id number] May 29, 1951  Procedure: Insertion of Arterial Catheter  Indications: Blood pressure monitoring and Frequent blood sampling  Procedure Details Consent: Unable to obtain consent because of emergent medical necessity. Time Out: Verified patient identification, verified procedure, site/side was marked, verified correct patient position, special equipment/implants available, medications/allergies/relevent history reviewed, required imaging and test results available.  Performed  Maximum sterile technique was used including antiseptics, cap, gloves, gown, hand hygiene, mask and sheet. Skin prep: Chlorhexidine; local anesthetic administered 20 gauge catheter was inserted into left radial artery using the Seldinger technique.  Evaluation Blood flow good; BP tracing good. Complications: No apparent complications.  Done by Shea Stakes, RRT  Hope Pigeon, [place] 11/11/2016

## 2016-11-13 NOTE — ED Provider Notes (Signed)
[facility] DEPT Provider Note   CSN: [id number] Arrival date & time: 10/28/2016  1332  By signing my name below, I, Ethelle Lyon Long, attest that this documentation has been prepared under the direction and in the presence of Noemi Chapel, MD. Electronically Signed: Ethelle Lyon Long, Scribe. 10/28/2016. 1:49 PM.    History   Chief Complaint No chief complaint on file.  LEVEL 5 DUE TO PT UNRESPONSIVE  The history is provided by the EMS personnel. The history is limited by the condition of the patient. No language interpreter was used.    HPI Comments:  Jeffery Conrad is a 66 y.o. male with PMHx COPD, HTN, Hepatitis C, CKD, CHF, and Non-Obstructive CAD and h/o coronary ischemia, MI, brought in by ambulance, who presents to the Emergency Department after neighbors found pt unresponsive, without respirations, in parking lot of an apartment complex. EMS arrived at scene and started CPR. No witness of cardiac arrest. No signs of trauma. Vtach noted en route with two shocks in total followed by epinephrine. After first round of epinephrine, pulses returned. EMS states presence of sinus rhythm. Afib rhythm present here in ED. Current Left bundle branch block identified. He has a left IO which medications were administered through by EMS. Pt has a h/o Crack cocaine use.    Past Medical History:  Diagnosis Date  . Accelerated hypertension   . Chronic systolic CHF (congestive heart failure) ([place])    a. 07/2014 Echo: EF 25-30%, diff HK, Gr 1 DD.  Marland Kitchen CKD (chronic kidney disease), stage II   . COPD (chronic obstructive pulmonary disease) ([place])   . Crack cocaine use   . Fracture 1976   Bilateral forearm compound fractures  . Hepatitis C    a. 07/2014 HCV AB reactive, HCV quant log: 5.7, HCV quant [id number].  Marland Kitchen NICM (nonischemic cardiomyopathy) ([place])    a. 07/2014 Echo: EF 25-30%, diff HK, Gr 1 DD.  . Non-obstructive CAD    a. 07/2014 Cath: LM nl, LAD nonobs, LCX nl, OM nl, RCA 50d, EF 20-25%   . Noncompliance   . Tobacco use disorder     Patient Active Problem List   Diagnosis Date Noted  . Liver fibrosis ([place]) 06/29/2015  . Chronic hepatitis C without hepatic coma ([place]) 03/01/2015  . HTN (hypertension) 08/31/2014  . Noncompliance   . Chronic systolic CHF (congestive heart failure) ([place])   . CKD (chronic kidney disease), stage II   . CAD (coronary artery disease), native coronary artery, moderate LAD disease, mild to mod RCA disease  07/29/2014  . NICM (nonischemic cardiomyopathy) ([place]) 07/29/2014  . Acute respiratory failure with hypoxia ([place]) 07/29/2014  . Accelerated hypertension 07/29/2014  . Hyperglycemia- resolved, with hgba1c 5.6 07/29/2014  . Polysubstance abuse 07/29/2014  . Tobacco use disorder 07/29/2014  . Congestive dilated cardiomyopathy ([place])   . Pulmonary vascular congestion   . NSTEMI (non-ST elevated myocardial infarction) ([place])   . Transaminitis   . Chronic renal insufficiency, stage II (m[facility])   . Acute pulmonary edema ([facility])   . Dyspnea   . Acute systolic heart failure ([place]) 07/26/2014    Past Surgical History:  Procedure Laterality Date  . CARDIAC CATHETERIZATION  07/28/14   Moderate proximal LAD disease bifurcation stenosis. Mild to moderate distal RCA disease, Heavily calcified left coronary, EF 20-25%  . FRACTURE SURGERY    . LEFT HEART CATHETERIZATION WITH CORONARY ANGIOGRAM N/A 07/27/2014   Procedure: LEFT HEART CATHETERIZATION WITH CORONARY ANGIOGRAM;  Surgeon: Sinclair Grooms, MD;  Location: [facility];  Service: Cardiovascular;  Laterality: N/A;  . TONSILLECTOMY         Home Medications    Prior to Admission medications   Medication Sig Start Date End Date Taking? Authorizing Provider  aspirin 81 MG EC tablet Take 1 tablet (81 mg total) by mouth daily. 10/06/15   Jolaine Artist, MD  atorvastatin (LIPITOR) 20 MG tablet Take 1 tablet (20 mg total) by mouth daily. 10/06/15   Jolaine Artist, MD  carvedilol (COREG) 6.25 MG  tablet Take 1.5 tablets (9.375 mg total) by mouth 2 (two) times daily. 10/06/15   Jolaine Artist, MD  furosemide (LASIX) 20 MG tablet Take 1 tablet (20 mg total) by mouth daily as needed. For weight gain 3 lbs or more overnight. 10/06/15   Shaune Pascal Bensimhon, MD  sacubitril-valsartan (ENTRESTO) 49-51 MG Take 1 tablet by mouth 2 (two) times daily. 10/06/15   Jolaine Artist, MD  spironolactone (ALDACTONE) 25 MG tablet Take 1 tablet (25 mg total) by mouth daily. 10/06/15   Jolaine Artist, MD    Family History No family history on file.  Social History Social History  Substance Use Topics  . Smoking status: Current Every Day Smoker    Packs/day: 0.50    Years: 40.00    Types: Cigarettes  . Smokeless tobacco: Never Used     Comment: cutting back  . Alcohol use 4.2 oz/week    7 Cans of beer per week     Comment: beer     Allergies   Patient has no known allergies.   Review of Systems Review of Systems  Unable to perform ROS: Patient unresponsive     Physical Exam Updated Vital Signs BP 133/85   Pulse 103   Resp 13   SpO2 100%   Physical Exam  Constitutional: He is oriented to person, place, and time. He appears well-nourished. He appears distressed.  HENT:  Head: Normocephalic and atraumatic.  Eyes: Conjunctivae are normal.  Neck:  Immobilized in c collar  Cardiovascular:  Tachycardic, irregularly irregular rhythm  Pulmonary/Chest: Effort normal.  Increased work of breathing, slowed respirations  Abdominal: He exhibits no distension.  Musculoskeletal: Normal range of motion.  Is no edema, no asymmetry, intraosseous line present in the proximal tibia  Neurological: He is alert and oriented to person, place, and time.  GCS of 3, does not follow commands, he does breathe spontaneously but does not have any spontaneous arm or leg movements.  Skin: Skin is warm and dry.  Psychiatric: He has a normal mood and affect.  Nursing note and vitals reviewed.    ED  Treatments / Results  DIAGNOSTIC STUDIES:  Oxygen Saturation is 99% on resuscitation bag normal by my interpretation.    COORDINATION OF CARE:  1:40 PM Discussed treatment plan with staff at bedside. Labs (all labs ordered are listed, but only abnormal results are displayed) Labs Reviewed - No data to display  EKG  EKG Interpretation  Date/Time:  Wednesday November 13 2016 13:33:13 EST Ventricular Rate:  146 PR Interval:    QRS Duration: 139 QT Interval:  351 QTC Calculation: 548 R Axis:   -46 Text Interpretation:  Atrial fibrillation Ventricular premature complex Left bundle branch block Baseline wander in lead(s) V3 Since last tracing Left bundle branch block still present Atrial fibrillation has replaced normal sinus rhyhtm Confirmed by Krisa Blattner  MD, Irena Gaydos ([id number]) on 11/02/2016 2:41:06 PM       Radiology No results found.  Procedures Procedures (including critical care time)  INTUBATION Performed by: Johnna Acosta  Required items: required blood products, implants, devices, and special equipment available Patient identity confirmed: provided demographic data and hospital-assigned identification number Time out: Immediately prior to procedure a "time out" was called to verify the correct patient, procedure, equipment, support staff and site/side marked as required.  Indications: respiartory / cardiac arrest  Intubation method: Glidescope Laryngoscopy   Preoxygenation: BVM  Sedatives: 10mg  Etomidate Paralytic: 100mg  Succinylcholine  Tube Size: 7.5  Cuffed - required bougie  Post-procedure assessment: chest rise and ETCO2 monitor Breath sounds: equal and absent over the epigastrium Tube secured with: ETT holder Chest x-ray interpreted by radiologist and me.  Chest x-ray findings: endotracheal tube in appropriate position  Patient tolerated the procedure well with no immediate complications.   Medications Ordered in ED Medications - No data to  display   Initial Impression / Assessment and Plan / ED Course  I have reviewed the triage vital signs and the nursing notes.  Pertinent labs & imaging results that were available during my care of the patient were reviewed by me and considered in my medical decision making (see chart for details).     The patient is critically ill, he presents with cardiac arrest, witnessed ventricular fibrillation in the field per EMS. I reviewed their rhythm strips and I agree with this. He did respond to cardioversion defibrillation and a single dose of epinephrine. He has had return of spontaneous circulation since that time and appears to be chewing on the endotracheal tube as well as breathing over the vent. That being said he does not have a normal mental status, he is progressively been in atrial fibrillation with a rapid ventricular rate and I do not see this rhythm in his past. He does have a known severe nonischemic cardiomyopathy, I discussed his care with the cardiologist who saw the patient in consultation as well as with the intensivist, Dr. Chase Caller who will admit the patient to the ICU. The decision was made that if he did not have any intracranial findings that he could have a heparin treatment for what appears to be a V. fib arrest. At this time it was not seen is the best interest of the patient taken to the catheterization lab. He is critically ill, critical care provided, intubation provided, CPR did not need to be directed as the patient had return of spontaneous circulation pre-hospital and maintain that state during his stay. Sedation was provided with propofol, Versed, fentanyl drip.  CRITICAL CARE Performed by: Johnna Acosta Total critical care time: 35 minutes Critical care time was exclusive of separately billable procedures and treating other patients. Critical care was necessary to treat or prevent imminent or life-threatening deterioration. Critical care was time spent personally  by me on the following activities: development of treatment plan with patient and/or surrogate as well as nursing, discussions with consultants, evaluation of patient's response to treatment, examination of patient, obtaining history from patient or surrogate, ordering and performing treatments and interventions, ordering and review of laboratory studies, ordering and review of radiographic studies, pulse oximetry and re-evaluation of patient's condition.   Final Clinical Impressions(s) / ED Diagnoses   Final diagnoses:  Ventricular fibrillation [facility])  Cardiac arrest [facility])    New Prescriptions New Prescriptions   No medications on file   I personally performed the services described in this documentation, which was scribed in my presence. The recorded information has been reviewed and is accurate.  Noemi Chapel, MD 11/14/16 [phone]

## 2016-11-13 NOTE — Progress Notes (Signed)
Pharmacy Antibiotic Note  Jeffery Conrad is a 66 y.o. male admitted on 11/04/2016 with sepsis.  Pharmacy has been consulted for vancomycin and zosyn dosing. Pt is afebrile and WBC is elevated at 12.3. Scr is elevated at 1.7 and lactic acid is elevated at 8.17.   Plan: Vanc 2gm IV x 1 then 750mg  IV Q12H Zosyn 3.375gm IV Q8H (4 hr inf) F/u renal fxn, C&S, clinical status and trough at SS  Height: 6\' 1"  (185.4 cm) Weight: 202 lb 13.2 oz (92 kg) IBW/kg (Calculated) : 79.9  Temp (24hrs), Avg:99.6 F (37.6 C), Min:99.6 F (37.6 C), Max:99.6 F (37.6 C)   Recent Labs Lab 11/11/2016 1352 10/30/2016 1353  WBC 12.3*  --   CREATININE  --  1.70*  LATICACIDVEN  --  8.17*    Estimated Creatinine Clearance: 47 mL/min (by C-G formula based on SCr of 1.7 mg/dL (H)).    No Known Allergies  Antimicrobials this admission: Vanc 2/21>> Zosyn 2/21>>  Dose adjustments this admission: N/A  Microbiology results: Pending  Thank you for allowing pharmacy to be a part of this patient's care.  Jeyli Zwicker, Rande Lawman 11/15/2016 2:49 PM

## 2016-11-13 NOTE — Progress Notes (Signed)
Patient heart rate in the low 30s. Elink called and notified. No new orders at this time.

## 2016-11-13 NOTE — Progress Notes (Signed)
Responded to CPR  to support patient and staff.  No family with patient.  Per EMS patient was found down by neighbor. Patient now intubated and being cooled.  Patient's finance' arrived and was escorted to consultation room then later to bedside. Provided emotional support and empathetic listening.  Chaplain available as needed.   11/01/2016 1400  Clinical Encounter Type  Visited With Patient;Family;Patient and family together;Health care provider  Visit Type Initial;Spiritual support  Referral From Nurse  Spiritual Encounters  Spiritual Needs Emotional  Stress Factors  Family Stress Factors Health changes;Lack of knowledge  Jaclynn Major, [place]

## 2016-11-13 NOTE — Progress Notes (Signed)
Patient temp at 34.4 Celsius per protocol, ELink notified that 33 Celsius has not been reached within 2 hours. No new orders given at this time.

## 2016-11-13 NOTE — ED Notes (Signed)
Patient Lab results given to Nurse.

## 2016-11-13 NOTE — H&P (Addendum)
PULMONARY / CRITICAL CARE MEDICINE   Name: Jeffery Conrad MRN: [id number] DOB: [birth year]    ADMISSION DATE:  11/17/2016 CONSULTATION DATE:  10/31/2016 Primary cardiologist Dr. Glori Bickers  REFERRING MD:  Noemi Chapel EDP  CHIEF COMPLAINT:  Cardiac arrest and coma  HISTORY OF PRESENT ILLNESS:   66 year old male known to have polysubstance abuse,  chronic systolic heart failure with ejection fraction 30% as of 2016 and also active hepatitis C but treatment was deferred because of ongoing polysubstance abuse. Last contact with healthcare providers within the health system was one year ago in March 2017. Current history is obtained from Dr. Noemi Chapel emergency room physician. Apparently patient was found down in a parking lot. Unknown downtime. When emergency medical services brought to the scene he was found cyanotic from the nipple up. He received defibrillation 2 for V. fib and then returned to spontaneous circulation rhythm with normal blood pressure. He has maintained this throughout. In the emergency department his Edison Pace airway was changed with difficulty to endotracheal tube. He has remained comatose but does have a cough and a gag and some respiration over the ventilator. His cyanosis improved. He continues to be normotensive. Critical care medicine called for consultation.  PAST MEDICAL HISTORY :  He  has a past medical history of Accelerated hypertension; Chronic systolic CHF (congestive heart failure) ([place]); CKD (chronic kidney disease), stage II; COPD (chronic obstructive pulmonary disease) ([place]); Crack cocaine use; Fracture (1976); Hepatitis C; NICM (nonischemic cardiomyopathy) ([place]); Non-obstructive CAD; Noncompliance; and Tobacco use disorder.  PAST SURGICAL HISTORY: He  has a past surgical history that includes Fracture surgery; Tonsillectomy; Cardiac catheterization (07/28/14); and left heart catheterization with coronary angiogram (N/A, 07/27/2014).  No Known  Allergies  No current facility-administered medications on file prior to encounter.    Current Outpatient Prescriptions on File Prior to Encounter  Medication Sig  . aspirin 81 MG EC tablet Take 1 tablet (81 mg total) by mouth daily.  Marland Kitchen atorvastatin (LIPITOR) 20 MG tablet Take 1 tablet (20 mg total) by mouth daily.  . carvedilol (COREG) 6.25 MG tablet Take 1.5 tablets (9.375 mg total) by mouth 2 (two) times daily.  . furosemide (LASIX) 20 MG tablet Take 1 tablet (20 mg total) by mouth daily as needed. For weight gain 3 lbs or more overnight.  . sacubitril-valsartan (ENTRESTO) 49-51 MG Take 1 tablet by mouth 2 (two) times daily.  Marland Kitchen spironolactone (ALDACTONE) 25 MG tablet Take 1 tablet (25 mg total) by mouth daily.    FAMILY HISTORY:  His has no family status information on file.    SOCIAL HISTORY: He  reports that he has been smoking Cigarettes.  He has a 20.00 pack-year smoking history. He has never used smokeless tobacco. He reports that he drinks about 4.2 oz of alcohol per week . He reports that he does not use drugs.  VITAL SIGNS: BP 96/56   Pulse (!) 53   Temp 99.6 F (37.6 C) (Rectal)   Resp 26   Ht 6\' 1"  (1.854 m)   Wt 92 kg (202 lb 13.2 oz)   SpO2 98%   BMI 26.76 kg/m   HEMODYNAMICS:    VENTILATOR SETTINGS: Vent Mode: PRVC FiO2 (%):  [100 %] 100 % Set Rate:  [14 bmp] 14 bmp Vt Set:  [640 mL] 640 mL PEEP:  [5 cmH20] 5 cmH20 Plateau Pressure:  [14 cmH20] 14 cmH20  INTAKE / OUTPUT: No intake/output data recorded.  PHYSICAL EXAMINATION: General:  Critically looking male in the  trauma bay in the emergency department at [facility] Neuro:  Unresponsive RA SS -5 on propofol drip HEENT:  Hard collar on . Endotracheal tube present. Pupils are not dilated Cardiovascular:  Normal heart sounds Lungs:  Synchronous with the ventilator. Mild triggering of the ventilator present Abdomen:  Soft. No mass  Musculoskeletal:  Skin: Appears intact in the exposed  areas  LABS:  PULMONARY  Recent Labs Lab 10/28/2016 1353  TCO2 18    CBC  Recent Labs Lab 11/10/2016 1352 11/10/2016 1353  HGB 16.0 16.3  HCT 45.9 48.0  WBC 12.3*  --   PLT 152  --     COAGULATION  Recent Labs Lab 11/18/2016 1352  INR 1.15    CARDIAC  No results for input(s): TROPONINI in the last 168 hours. No results for input(s): PROBNP in the last 168 hours.   CHEMISTRY  Recent Labs Lab 10/29/2016 1353  NA 135  K 3.7  CL 103  GLUCOSE 191*  BUN 8  CREATININE 1.70*   Estimated Creatinine Clearance: 47 mL/min (by C-G formula based on SCr of 1.7 mg/dL (H)).   LIVER  Recent Labs Lab 10/29/2016 1352  INR 1.15     INFECTIOUS  Recent Labs Lab 11/16/2016 1353  LATICACIDVEN 8.17*     ENDOCRINE CBG (last 3)  No results for input(s): GLUCAP in the last 72 hours.       IMAGING x48h  - image(s) personally visualized  -   highlighted in bold No results found.       ASSESSMENT / PLAN:  PULMONARY A: #baseline: Smoker with polysubstance abuse including cocaine #current: Acute respiratory failure secondary to cardiac arrest  difficult airway in the ER during airway exchange 11/14/2016    P:    Full ventilator support Bronchodilators  CARDIOVASCULAR A:  #baseline: Chronic systolic heart failure ejection fraction 30% in 2016 #current V. fib cardiac arrest presumed due to cocaine versus MI   P:  Cardiology consultation MAP goal > 80 Cycle enzumes  RENAL No intake or output data in the 24 hours ending 11/10/2016 1421  Recent Labs Lab 11/14/2016 1353  CREATININE 1.70*     A:   #baseline: Baseline creatinine of 1.3 mg percent August 2016 #current acute kidney injury    P:   Maintain hemodynamics  GASTROINTESTINAL A:    History of hepatitis C treatment deferred because of polysubstance abuse P:   Tube feeds PPI  HEMATOLOGIC  Recent Labs Lab 10/27/2016 1352 11/12/2016 1353  HGB 16.0 16.3  HCT 45.9 48.0  WBC 12.3*   --   PLT 152  --     A:   #RBC: At risk for anemia of critical illness *  P:  - PRBC for hgb </= 6.9gm%    - exceptions are   -  if ACS susepcted/confirmed then transfuse for hgb </= 8.0gm%,  or    -  active bleeding with hemodynamic instability, then transfuse regardless of hemoglobin value   At at all times try to transfuse 1 unit prbc as possible with exception of active hemorrhage    INFECTIOUS No results for input(s): PROCALCITON in the last 168 hours.  Results for orders placed or performed during the hospital encounter of 07/26/14  MRSA PCR Screening     Status: None   Collection Time: 07/26/14  7:19 AM  Result Value Ref Range Status   MRSA by PCR NEGATIVE NEGATIVE Final    Comment:        The GeneXpert  MRSA Assay (FDA approved for NASAL specimens only), is one component of a comprehensive MRSA colonization surveillance program. It is not intended to diagnose MRSA infection nor to guide or monitor treatment for MRSA infections.     A:   No evidence of infection P:   Anti-infectives    None    check RVP and HIV   ENDOCRINE A:   At risk for hyperglycemia P:   ICU hyperglycemia protocol  NEUROLOGIC A:   #Baseline : Polysubstance abuse  #Current:   Comatose deep Coma ddFollowing cardiac arrest   P:   RASS goal: -4/-5 with fentanyl and propofol infusion Induced hypothermia with Arctic sun  Urine drug screen test [place] CT head and CT neck   FAMILY  - Updates: 10/27/2016 --> No family at bedsideinary family meet or Palliative Care meeting due by:  DAy 7. Current LOS is LOS 0 days   DISPO Transfer to Cardiac ICU  The patient is critically ill with multiple organ systems failure and requires high complexity decision making for assessment and support, frequent evaluation and titration of therapies, application of advanced monitoring technologies and extensive interpretation of multiple databases.   Critical Care Time devoted to patient care  services described in this note is  40 minnutes. This time reflects time of care of this signee Dr Brand Males. This critical care time does not reflect procedure time, or teaching time or supervisory time of PA/NP/Med student/Med Resident etc but could involve care discussion time    Dr. Brand Males, M.D., [facility].C.P Pulmonary and Critical Care Medicine Staff Physician [place] [facility] Pager: [phone], If no answer or between  15:00h - 7:00h: call [phone]  11/05/2016 2:21 PM

## 2016-11-13 NOTE — ED Notes (Signed)
Family at bedside. MD speaking with family

## 2016-11-13 NOTE — ED Triage Notes (Signed)
Pt arrives from parking lot of apartment building via [facility] s/p CPR, ROSC.  EMS reports pt found unresponsive, agonal resp, compressions started by bystanders.  ES reports initial rhythm VTach, 2 shocks given followed by 1 Epi.  EMS reports ROSC after 1 Epi, heart rhythm variable, afib on arrival, spontaneous resp through Malayja Freund airway.

## 2016-11-13 NOTE — Progress Notes (Signed)
Central line okay to use per Warren Lacy, MD.

## 2016-11-13 NOTE — Progress Notes (Signed)
Pt arrived to 4n16. Ice pads on upon arrival. Fentanyl, Propofol, and Nimbex all running prior to arrival. Temp foley placed via [facility] and [facility] [place] per target temperature management protocol. Arctic sun pads applied. Cooling started at 1800. No shivering noted. VSS. Will continue to monitor.

## 2016-11-13 NOTE — Procedures (Signed)
Central Venous Catheter Insertion Procedure Note Jeffery Conrad [id number] 1951/05/13  Procedure: Insertion of Central Venous Catheter Indications: Assessment of intravascular volume, Drug and/or fluid administration and Frequent blood sampling  Procedure Details Consent: Risks of procedure as well as the alternatives and risks of each were explained to the (patient/caregiver).  Consent for procedure obtained. Time Out: Verified patient identification, verified procedure, site/side was marked, verified correct patient position, special equipment/implants available, medications/allergies/relevent history reviewed, required imaging and test results available.  Performed  Maximum sterile technique was used including antiseptics, cap, gloves, gown, hand hygiene, mask and sheet. Skin prep: Chlorhexidine; local anesthetic administered A antimicrobial bonded/coated triple lumen catheter was placed in the left subclavian vein using the Seldinger technique.  Evaluation Blood flow good Complications: No apparent complications Patient did tolerate procedure well. Chest X-ray ordered to verify placement.  CXR: pending.  Procedure performed under direct ultrasound guidance for real time vessel cannulation.      Montey Hora, [place] [facility] Pager: [phone]  or [phone] 11/19/2016, 8:56 PM

## 2016-11-13 NOTE — ED Notes (Signed)
Family at bedside. 

## 2016-11-13 NOTE — Plan of Care (Signed)
Problem: Cardiac: Goal: Ability to achieve and maintain adequate cardiopulmonary perfusion will improve Outcome: Not Met (add Reason) Patient started hypothermia protocol at 1800.

## 2016-11-13 NOTE — Consult Note (Signed)
Date: 11/06/2016               Patient Name:  Jeffery Conrad MRN: [id number]  DOB: 1950/10/26 Age / Sex: 66 y.o., male   PCP: Pcp Not In System         Requesting Physician: Dr. Noemi Chapel, MD    Consulting Reason:  Cardiac arrest     Chief Complaint:   History of Present Illness: Patient is a 66 year old male with a past medical history of coronary artery disease, NICM, hypertension, CKD stage II, liver fibrosis, chronic hep C, COPD, crack/ cocaine abuse presenting to the ED for cardiac arrest. Patient is currently intubated and sedated; no history could be obtained from him. History obtained from patient's nurse and ED provider documentation. Patient was found unresponsive with agonal breathing in his apartment complex parking lot by neighbors. Bystanders initiated CPR and 911 was called. When EMS arrived, patient had already received about 10 mins of CPR and was found patient to in [place]. He received 2 shocks and 1 epinephrine en route to the hospital; ROSC achieved after 1 epinephrine given, rhythm variable. Found to be in Afib in the ED and LBBB identified. On arrival to the ED, pulse 140, respiratory rate 12, and blood pressure 133/85. On the monitor, patient was noted to be in A. fib with heart rate in the 130s-150s.  EKG from November 2015 showing T-wave inversions in the anterolateral leads. EKG done today showing A. fib with heart rate 146. Cardiac cath done in November 2015 showing nonobstructive coronary artery disease - moderate proximal LAD disease bifurcation stenosis, mild to moderate distal RCA disease. Echo done August 2016 showing left ventricular ejection fraction 35-40% and grade 1 diastolic dysfunction.  Meds: Current Facility-Administered Medications  Medication Dose Route Frequency Provider Last Rate Last Dose  . amiodarone (NEXTERONE PREMIX) 360-4.14 MG/200ML-% (1.8 mg/mL) IV infusion  60 mg/hr Intravenous Continuous Noemi Chapel, MD       Followed by  . amiodarone  (NEXTERONE PREMIX) 360-4.14 MG/200ML-% (1.8 mg/mL) IV infusion  30 mg/hr Intravenous Continuous Noemi Chapel, MD      . amiodarone (NEXTERONE) IV bolus only 150 mg/100 mL  150 mg Intravenous Once Noemi Chapel, MD      . propofol (DIPRIVAN) 1000 MG/100ML infusion  5-80 mcg/kg/min Intravenous Titrated Noemi Chapel, MD       Current Outpatient Prescriptions  Medication Sig Dispense Refill  . aspirin 81 MG EC tablet Take 1 tablet (81 mg total) by mouth daily. 30 tablet 1  . atorvastatin (LIPITOR) 20 MG tablet Take 1 tablet (20 mg total) by mouth daily. 30 tablet 6  . carvedilol (COREG) 6.25 MG tablet Take 1.5 tablets (9.375 mg total) by mouth 2 (two) times daily. 90 tablet 6  . furosemide (LASIX) 20 MG tablet Take 1 tablet (20 mg total) by mouth daily as needed. For weight gain 3 lbs or more overnight. 15 tablet 0  . sacubitril-valsartan (ENTRESTO) 49-51 MG Take 1 tablet by mouth 2 (two) times daily. 60 tablet 6  . spironolactone (ALDACTONE) 25 MG tablet Take 1 tablet (25 mg total) by mouth daily. 30 tablet 6    Allergies: Allergies as of 11/01/2016  . (No Known Allergies)   Past Medical History:  Diagnosis Date  . Accelerated hypertension   . Chronic systolic CHF (congestive heart failure) ([place])    a. 07/2014 Echo: EF 25-30%, diff HK, Gr 1 DD.  Marland Kitchen CKD (chronic kidney disease), stage II   . COPD (chronic obstructive  pulmonary disease) ([place])   . Crack cocaine use   . Fracture 1976   Bilateral forearm compound fractures  . Hepatitis C    a. 07/2014 HCV AB reactive, HCV quant log: 5.7, HCV quant [id number].  Marland Kitchen NICM (nonischemic cardiomyopathy) ([place])    a. 07/2014 Echo: EF 25-30%, diff HK, Gr 1 DD.  . Non-obstructive CAD    a. 07/2014 Cath: LM nl, LAD nonobs, LCX nl, OM nl, RCA 50d, EF 20-25%  . Noncompliance   . Tobacco use disorder    Past Surgical History:  Procedure Laterality Date  . CARDIAC CATHETERIZATION  07/28/14   Moderate proximal LAD disease bifurcation stenosis. Mild to  moderate distal RCA disease, Heavily calcified left coronary, EF 20-25%  . FRACTURE SURGERY    . LEFT HEART CATHETERIZATION WITH CORONARY ANGIOGRAM N/A 07/27/2014   Procedure: LEFT HEART CATHETERIZATION WITH CORONARY ANGIOGRAM;  Surgeon: Sinclair Grooms, MD;  Location: [facility] CATH LAB;  Service: Cardiovascular;  Laterality: N/A;  . TONSILLECTOMY     No family history on file. Social History   Social History  . Marital status: Divorced    Spouse name: N/A  . Number of children: N/A  . Years of education: N/A   Occupational History  . Not on file.   Social History Main Topics  . Smoking status: Current Every Day Smoker    Packs/day: 0.50    Years: 40.00    Types: Cigarettes  . Smokeless tobacco: Never Used     Comment: cutting back  . Alcohol use 4.2 oz/week    7 Cans of beer per week     Comment: beer  . Drug use: No     Comment: pt states last use 04/24/15  . Sexual activity: Yes   Other Topics Concern  . Not on file   Social History Narrative  . No narrative on file    Review of Systems: Patient is currently intubated and sedated. No history could be obtained from him.  Physical Exam: Blood pressure 133/85, pulse 103, resp. rate 13, height 6\' 1"  (1.854 m), SpO2 100 %. Physical Exam  Cardiovascular: Intact distal pulses.  Exam reveals no gallop and no friction rub.   No murmur heard. Distant heart sounds Irregularly irregular rhythm  Pulmonary/Chest:  Ventilatory supported breath sounds. Anterior lung fields clear to auscultation.  Abdominal: Soft. Bowel sounds are normal. He exhibits no distension.  Musculoskeletal: He exhibits no edema.  Skin: Skin is warm and dry.    Lab results: Basic Metabolic Panel:  Recent Labs  11/16/2016 1353  NA 135  K 3.7  CL 103  GLUCOSE 191*  BUN 8  CREATININE 1.70*   CBC:  Recent Labs  11/11/2016 1353  HGB 16.3  HCT 48.0   Urine Drug Screen: Drugs of Abuse     Component Value Date/Time   LABOPIA NEG 10/23/2015 1203    COCAINSCRNUR POS (A) 10/23/2015 1203   LABBENZ NEG 10/23/2015 1203   AMPHETMU NEG 10/23/2015 1203   AMPHETMU NONE DETECTED 07/26/2014 0437   THCU NONE DETECTED 07/26/2014 0437   LABBARB NONE DETECTED 07/26/2014 0437    Imaging results:  No results found.  Other results: EKG: A. fib with heart rate 146.  Assessment, Plan, & Recommendations by Problem: Active Problems:   * No active hospital problems. *  Cardiac arrest: Patient came in for questionable Vtach arrest. Received 2 shocks and 1 epinephrine en route to the hospital. Rhythm Afib in the ED. EKG showing A. fib  with heart rate 146. He was started on Amiodarone bolus and converted to NSR. Amiodarone was discontinued. Although, he has history of coronary artery disease, cardiac cath done in November 2015 showing nonobstructive coronary artery disease. POC troponin 0.03. Patient likely went into arrhythmia 2/2 hx of non-ischemic cardiomyopathy. Echo done August 2016 showing left ventricular ejection fraction 35-40%. Patient does not need to be taken to the cath lab at this time. Continue medical mgmt.  -Monitor in the ICU -Initiate hypothermia protocol -Start heparin for anticoagulation. Head CT negative for acute intracranial bleed. -Continue to trend troponin  -Echo  Signed: Shela Leff, MD 11/15/2016, 1:58 PM   Patient seen, examined. Available data reviewed. Agree with findings, assessment, and plan as outlined by Dr Marlowe Sax, MD. The patient is evaluated in the emergency department. He is comatose after out of hospital cardiac arrest. His history is reviewed in detail. He has a history of nonischemic cardiomyopathy with severe LV dysfunction. Recent outpatient notes are reviewed and he continues to struggle with substance abuse based on review of outpatient reports. He presents today with out of hospital cardiac arrest with details as outlined above. History is obtained from emergency room staff as well as the patient's  fianc. The patient's EKG is reviewed and does not show evidence of ST elevation MI. His initial troponin is 0.16. Exam reveals an unconscious, intubated patient. JVP is difficult to assess. There are no carotid bruits. Lung fields are clear. Heart is regular rate and rhythm. Extremities have no edema. The patient is unresponsive. Peripheral pulses are 2+. Recommend supportive care at this time. Cardiac catheterization is not indicated as I suspect he had a primary arrhythmic event with VF in the setting of his known cardiomyopathy and ongoing substance abuse. Agree with hypothermia protocol. Appreciate CCM care of this patient. It is reasonable to treat him with IV heparin, cycle enzymes, update an echocardiogram, and follow his progress carefully. He obviously is at risk for hypoxic brain injury.  Sherren Mocha, M.D. 11/12/2016 6:49 PM

## 2016-11-14 ENCOUNTER — Inpatient Hospital Stay (HOSPITAL_COMMUNITY): Payer: Medicare Other

## 2016-11-14 ENCOUNTER — Encounter (HOSPITAL_COMMUNITY): Payer: Self-pay | Admitting: Emergency Medicine

## 2016-11-14 DIAGNOSIS — G9340 Encephalopathy, unspecified: Secondary | ICD-10-CM

## 2016-11-14 DIAGNOSIS — I255 Ischemic cardiomyopathy: Secondary | ICD-10-CM

## 2016-11-14 DIAGNOSIS — I4901 Ventricular fibrillation: Principal | ICD-10-CM

## 2016-11-14 LAB — RESPIRATORY PANEL BY PCR
ADENOVIRUS-RVPPCR: NOT DETECTED
Bordetella pertussis: NOT DETECTED
CORONAVIRUS 229E-RVPPCR: NOT DETECTED
CORONAVIRUS HKU1-RVPPCR: NOT DETECTED
CORONAVIRUS NL63-RVPPCR: NOT DETECTED
Chlamydophila pneumoniae: NOT DETECTED
Coronavirus OC43: NOT DETECTED
Influenza A: NOT DETECTED
Influenza B: NOT DETECTED
METAPNEUMOVIRUS-RVPPCR: NOT DETECTED
Mycoplasma pneumoniae: NOT DETECTED
PARAINFLUENZA VIRUS 2-RVPPCR: NOT DETECTED
PARAINFLUENZA VIRUS 3-RVPPCR: NOT DETECTED
Parainfluenza Virus 1: NOT DETECTED
Parainfluenza Virus 4: NOT DETECTED
RHINOVIRUS / ENTEROVIRUS - RVPPCR: NOT DETECTED
Respiratory Syncytial Virus: NOT DETECTED

## 2016-11-14 LAB — BASIC METABOLIC PANEL
ANION GAP: 9 (ref 5–15)
ANION GAP: 6 (ref 5–15)
ANION GAP: 10 (ref 5–15)
ANION GAP: 6 (ref 5–15)
BUN: 11 mg/dL (ref 6–20)
BUN: 11 mg/dL (ref 6–20)
BUN: 11 mg/dL (ref 6–20)
BUN: 11 mg/dL (ref 6–20)
CALCIUM: 8.5 mg/dL — AB (ref 8.9–10.3)
CO2: 19 mmol/L — AB (ref 22–32)
CO2: 19 mmol/L — ABNORMAL LOW (ref 22–32)
CO2: 19 mmol/L — ABNORMAL LOW (ref 22–32)
CO2: 19 mmol/L — ABNORMAL LOW (ref 22–32)
Calcium: 7.9 mg/dL — ABNORMAL LOW (ref 8.9–10.3)
Calcium: 8 mg/dL — ABNORMAL LOW (ref 8.9–10.3)
Calcium: 8.5 mg/dL — ABNORMAL LOW (ref 8.9–10.3)
Chloride: 108 mmol/L (ref 101–111)
Chloride: 112 mmol/L — ABNORMAL HIGH (ref 101–111)
Chloride: 109 mmol/L (ref 101–111)
Chloride: 112 mmol/L — ABNORMAL HIGH (ref 101–111)
Creatinine, Ser: 1.79 mg/dL — ABNORMAL HIGH (ref 0.61–1.24)
Creatinine, Ser: 1.85 mg/dL — ABNORMAL HIGH (ref 0.61–1.24)
Creatinine, Ser: 1.91 mg/dL — ABNORMAL HIGH (ref 0.61–1.24)
Creatinine, Ser: 1.93 mg/dL — ABNORMAL HIGH (ref 0.61–1.24)
GFR calc Af Amer: 43 mL/min — ABNORMAL LOW (ref >60)
GFR, EST AFRICAN AMERICAN: 41 mL/min — AB (ref >60)
GFR, EST AFRICAN AMERICAN: 40 mL/min — AB (ref >60)
GFR, EST AFRICAN AMERICAN: 39 mL/min — AB (ref >60)
GFR, EST NON AFRICAN AMERICAN: 37 mL/min — AB (ref >60)
GFR, EST NON AFRICAN AMERICAN: 36 mL/min — AB (ref >60)
GFR, EST NON AFRICAN AMERICAN: 34 mL/min — AB (ref >60)
GFR, EST NON AFRICAN AMERICAN: 34 mL/min — AB (ref >60)
GLUCOSE: 113 mg/dL — AB (ref 65–99)
GLUCOSE: 115 mg/dL — AB (ref 65–99)
GLUCOSE: 103 mg/dL — AB (ref 65–99)
GLUCOSE: 126 mg/dL — AB (ref 65–99)
POTASSIUM: 3.4 mmol/L — AB (ref 3.5–5.1)
POTASSIUM: 3.6 mmol/L (ref 3.5–5.1)
POTASSIUM: 3.6 mmol/L (ref 3.5–5.1)
POTASSIUM: 3.8 mmol/L (ref 3.5–5.1)
SODIUM: 137 mmol/L (ref 135–145)
Sodium: 136 mmol/L (ref 135–145)
Sodium: 137 mmol/L (ref 135–145)
Sodium: 138 mmol/L (ref 135–145)

## 2016-11-14 LAB — CBC
HEMATOCRIT: 42.5 % (ref 39.0–52.0)
HEMOGLOBIN: 14.8 g/dL (ref 13.0–17.0)
MCH: 30 pg (ref 26.0–34.0)
MCHC: 34.8 g/dL (ref 30.0–36.0)
MCV: 86 fL (ref 78.0–100.0)
Platelets: 115 10*3/uL — ABNORMAL LOW (ref 150–400)
RBC: 4.94 MIL/uL (ref 4.22–5.81)
RDW: 12.7 % (ref 11.5–15.5)
WBC: 9.2 10*3/uL (ref 4.0–10.5)

## 2016-11-14 LAB — TROPONIN I
TROPONIN I: 0.76 ng/mL — AB (ref <0.03)
Troponin I: 0.59 ng/mL (ref <0.03)

## 2016-11-14 LAB — ECHOCARDIOGRAM COMPLETE
HEIGHTINCHES: 73 in
Weight: 3188.73 oz

## 2016-11-14 LAB — POCT I-STAT 3, ART BLOOD GAS (G3+)
Acid-base deficit: 4 mmol/L — ABNORMAL HIGH (ref 0.0–2.0)
Bicarbonate: 19.9 mmol/L — ABNORMAL LOW (ref 20.0–28.0)
O2 Saturation: 99 %
PCO2 ART: 28.5 mmHg — AB (ref 32.0–48.0)
PH ART: 7.434 (ref 7.350–7.450)
TCO2: 21 mmol/L (ref 0–100)
pO2, Arterial: 98 mmHg (ref 83.0–108.0)

## 2016-11-14 LAB — POCT I-STAT, CHEM 8
BUN: 12 mg/dL (ref 6–20)
BUN: 12 mg/dL (ref 6–20)
CHLORIDE: 105 mmol/L (ref 101–111)
CREATININE: 1.7 mg/dL — AB (ref 0.61–1.24)
Calcium, Ion: 1.1 mmol/L — ABNORMAL LOW (ref 1.15–1.40)
Calcium, Ion: 1.11 mmol/L — ABNORMAL LOW (ref 1.15–1.40)
Chloride: 104 mmol/L (ref 101–111)
Creatinine, Ser: 1.6 mg/dL — ABNORMAL HIGH (ref 0.61–1.24)
Glucose, Bld: 122 mg/dL — ABNORMAL HIGH (ref 65–99)
Glucose, Bld: 125 mg/dL — ABNORMAL HIGH (ref 65–99)
HEMATOCRIT: 42 % (ref 39.0–52.0)
HEMATOCRIT: 45 % (ref 39.0–52.0)
Hemoglobin: 14.3 g/dL (ref 13.0–17.0)
Hemoglobin: 15.3 g/dL (ref 13.0–17.0)
POTASSIUM: 3.6 mmol/L (ref 3.5–5.1)
POTASSIUM: 4.4 mmol/L (ref 3.5–5.1)
SODIUM: 138 mmol/L (ref 135–145)
Sodium: 140 mmol/L (ref 135–145)
TCO2: 20 mmol/L (ref 0–100)
TCO2: 21 mmol/L (ref 0–100)

## 2016-11-14 LAB — GLUCOSE, CAPILLARY
GLUCOSE-CAPILLARY: 113 mg/dL — AB (ref 65–99)
GLUCOSE-CAPILLARY: 115 mg/dL — AB (ref 65–99)
GLUCOSE-CAPILLARY: 148 mg/dL — AB (ref 65–99)
GLUCOSE-CAPILLARY: 126 mg/dL — AB (ref 65–99)
GLUCOSE-CAPILLARY: 109 mg/dL — AB (ref 65–99)
GLUCOSE-CAPILLARY: 80 mg/dL (ref 65–99)
Glucose-Capillary: 106 mg/dL — ABNORMAL HIGH (ref 65–99)
Glucose-Capillary: 109 mg/dL — ABNORMAL HIGH (ref 65–99)
Glucose-Capillary: 116 mg/dL — ABNORMAL HIGH (ref 65–99)
Glucose-Capillary: 119 mg/dL — ABNORMAL HIGH (ref 65–99)
Glucose-Capillary: 124 mg/dL — ABNORMAL HIGH (ref 65–99)

## 2016-11-14 LAB — PROCALCITONIN: PROCALCITONIN: 1.82 ng/mL

## 2016-11-14 LAB — CORTISOL: CORTISOL PLASMA: 3.6 ug/dL

## 2016-11-14 LAB — PHOSPHORUS: PHOSPHORUS: 3.5 mg/dL (ref 2.5–4.6)

## 2016-11-14 LAB — HIV ANTIBODY (ROUTINE TESTING W REFLEX): HIV SCREEN 4TH GENERATION: NONREACTIVE

## 2016-11-14 LAB — MAGNESIUM: MAGNESIUM: 1.9 mg/dL (ref 1.7–2.4)

## 2016-11-14 MED ORDER — SODIUM CHLORIDE 0.9 % IV SOLN
0.0000 mg/h | INTRAVENOUS | Status: DC
  Administered 2016-11-14: 2 mg/h via INTRAVENOUS
  Administered 2016-11-14: 3 mg/h via INTRAVENOUS
  Filled 2016-11-14 (×3): qty 10

## 2016-11-14 MED ORDER — ENOXAPARIN SODIUM 40 MG/0.4ML ~~LOC~~ SOLN
40.0000 mg | Freq: Every day | SUBCUTANEOUS | Status: DC
  Administered 2016-11-14 – 2016-11-15 (×2): 40 mg via SUBCUTANEOUS
  Filled 2016-11-14 (×2): qty 0.4

## 2016-11-14 MED ORDER — CHLORHEXIDINE GLUCONATE CLOTH 2 % EX PADS
6.0000 | MEDICATED_PAD | Freq: Every day | CUTANEOUS | Status: DC
  Administered 2016-11-14 – 2016-11-22 (×9): 6 via TOPICAL

## 2016-11-14 MED ORDER — DOPAMINE-DEXTROSE 3.2-5 MG/ML-% IV SOLN
5.0000 ug/kg/min | INTRAVENOUS | Status: DC
  Administered 2016-11-14: 5 ug/kg/min via INTRAVENOUS
  Filled 2016-11-14 (×2): qty 250

## 2016-11-14 MED ORDER — MIDAZOLAM BOLUS VIA INFUSION
1.0000 mg | INTRAVENOUS | Status: DC | PRN
  Filled 2016-11-14: qty 2

## 2016-11-14 MED ORDER — MIDAZOLAM HCL 2 MG/2ML IJ SOLN: 1.0000 mg | INTRAMUSCULAR | Status: DC | PRN

## 2016-11-14 MED ORDER — MAGNESIUM SULFATE 2 GM/50ML IV SOLN
2.0000 g | Freq: Once | INTRAVENOUS | Status: AC
  Administered 2016-11-14: 2 g via INTRAVENOUS
  Filled 2016-11-14: qty 50

## 2016-11-14 MED ORDER — SODIUM CHLORIDE 0.9 % IV SOLN
30.0000 meq | Freq: Once | INTRAVENOUS | Status: AC
  Administered 2016-11-14: 30 meq via INTRAVENOUS
  Filled 2016-11-14: qty 15

## 2016-11-14 NOTE — H&P (Addendum)
PULMONARY / CRITICAL CARE MEDICINE   Name: Jeffery Conrad MRN: [id number] DOB: 01-16-1951    ADMISSION DATE:  11/16/2016 CONSULTATION DATE:  11/10/2016 Primary cardiologist Dr. Glori Bickers  REFERRING MD:  Noemi Chapel EDP  CHIEF COMPLAINT:  Cardiac arrest and coma  HISTORY OF PRESENT ILLNESS:   66 year old male known to have polysubstance abuse,  chronic systolic heart failure with ejection fraction 30% as of 2016 and also active hepatitis C but treatment was deferred because of ongoing polysubstance abuse. Last contact with healthcare providers within the health system was one year ago in March 2017. Cocaine user, VF arrest unknown downtime, cooled.  Subjective: at goal temp, some brady  VITAL SIGNS: BP (!) 114/56   Pulse (!) 44   Temp (!) 91.9 F (33.3 C) (Core (Comment))   Resp 14   Ht 6\' 1"  (1.854 m)   Wt 90.4 kg (199 lb 4.7 oz)   SpO2 99%   BMI 26.29 kg/m   HEMODYNAMICS: CVP:  [3 mmHg-6 mmHg] 6 mmHg  VENTILATOR SETTINGS: Vent Mode: PRVC FiO2 (%):  [40 %-100 %] 40 % Set Rate:  [14 bmp] 14 bmp Vt Set:  [640 mL] 640 mL PEEP:  [5 cmH20] 5 cmH20 Plateau Pressure:  [14 cmH20-22 cmH20] 21 cmH20  INTAKE / OUTPUT: I/O last 3 completed shifts: In: 4661.6 [I.V.:4361.6; IV Piggyback:300] Out: 49 [Urine:1225; Emesis/NG output:15]  PHYSICAL EXAMINATION: General:  Critically looking male in the trauma with collar, eeg now Neuro:  Paralyzed, 2 mm not reacting HEENT:  Collar, ett Cardiovascular: s1 s2 rr brady Lungs:  Synchronous with the ventilator. ronchi Abdomen:  Soft. No mass  Musculoskeletal:  Skin: Appears intact in the exposed areas  LABS:  PULMONARY  Recent Labs Lab 10/29/2016 1529 11/07/2016 1824 11/20/2016 2012 11/17/2016 2204 11/14/16 0011 11/14/16 0404  PHART 7.261*  --   --   --   --   --   PCO2ART 46.4  --   --   --   --   --   PO2ART 420.0*  --   --   --   --   --   HCO3 20.9  --   --   --   --   --   TCO2 22 19 21 20 20 21   O2SAT 100.0   --   --   --   --   --     CBC  Recent Labs Lab 11/14/2016 1352  11/14/16 0011 11/14/16 0404 11/14/16 0433  HGB 16.0  < > 15.3 14.3 14.8  HCT 45.9  < > 45.0 42.0 42.5  WBC 12.3*  --   --   --  9.2  PLT 152  --   --   --  115*  < > = values in this interval not displayed.  COAGULATION  Recent Labs Lab 11/12/2016 1352 10/26/2016 1510 11/17/2016 2229  INR 1.15 1.22 1.07    CARDIAC    Recent Labs Lab 11/18/2016 1510 10/30/2016 1842 11/14/16 0201  TROPONINI 0.16* 0.89* 0.76*   No results for input(s): PROBNP in the last 168 hours.   CHEMISTRY  Recent Labs Lab 11/20/2016 1352  11/19/2016 1510 10/30/2016 1824 11/16/2016 2012 10/26/2016 2204 11/14/16 0011 11/14/16 0404 11/14/16 0433  NA 135  < > 139 138 137 137 138 140  --   K 3.7  < > 3.2* 4.8 5.1 4.9 4.4 3.6  --   CL 103  < > 119* 105 106 105 105 104  --  CO2 14*  --  14*  --   --   --   --   --   --   GLUCOSE 190*  < > 141* 152* 162* 154* 125* 122*  --   BUN 8  < > 7 11 12 12 12 12   --   CREATININE 1.91*  < > 1.18 1.50* 1.50* 1.60* 1.60* 1.70*  --   CALCIUM 8.4*  --  5.4*  --   --   --   --   --   --   MG  --   --   --   --   --   --   --   --  1.9  PHOS  --   --   --   --   --   --   --   --  3.5  < > = values in this interval not displayed. Estimated Creatinine Clearance: 47 mL/min (by C-G formula based on SCr of 1.7 mg/dL (H)).   LIVER  Recent Labs Lab 11/19/2016 1352 11/09/2016 1510 11/17/2016 2229  AST 66*  --   --   ALT 60  --   --   ALKPHOS 63  --   --   BILITOT 1.2  --   --   PROT 7.3  --   --   ALBUMIN 3.2*  --   --   INR 1.15 1.22 1.07     INFECTIOUS  Recent Labs Lab 11/12/2016 1353 10/25/2016 1510 11/11/2016 1530 10/30/2016 1842 11/14/16 0433  LATICACIDVEN 8.17*  --  1.3 1.2  --   PROCALCITON  --  <0.10  --   --  1.82     ENDOCRINE CBG (last 3)   Recent Labs  11/14/16 0401 11/14/16 0600 11/14/16 0751  GLUCAP 124* 148* 109*         IMAGING x48h  - image(s) personally visualized  -    highlighted in bold Ct Head Wo Contrast  Result Date: 10/29/2016 CLINICAL DATA:  Found on the ground unresponsive.  Cardiac arrest. EXAM: CT HEAD WITHOUT CONTRAST CT CERVICAL SPINE WITHOUT CONTRAST TECHNIQUE: Multidetector CT imaging of the head and cervical spine was performed following the standard protocol without intravenous contrast. Multiplanar CT image reconstructions of the cervical spine were also generated. COMPARISON:  None. FINDINGS: CT HEAD FINDINGS Brain: Mild age related volume loss. No evidence of old or acute focal infarction, mass lesion, hemorrhage, hydrocephalus or extra-axial collection. Vascular: There is atherosclerotic calcification of the major vessels at the base of the brain. Skull: No skull fracture or focal lesion. Sinuses/Orbits: Chronic inflammatory changes of the paranasal sinuses on the left. Other: None significant CT CERVICAL SPINE FINDINGS Alignment: Straightening of the normal cervical lordosis. Skull base and vertebrae: No fracture or focal lesion. Soft tissues and spinal canal: No soft tissue lesion. Disc levels: C3-4: Chronic facet fusion on the right. Foraminal narrowing on the right because of osteophytic encroachment. C4-5: Chronic degenerative spondylosis with osteophytic encroachment upon the foramina bilaterally. C5-6: Distant anterior fusion. Wide patency of the canal. Foramina sufficiently patent. C6-7:  Mild spondylosis.  Mild foraminal narrowing on the left. C7-T1: Bilateral facet arthropathy with 1 mm of anterolisthesis. Upper chest: Benign scarring. Other: None significant IMPRESSION: Head CT: No acute or traumatic finding. Mild age related volume loss. Cervical spine CT: No acute or traumatic finding. Chronic degenerative and fusion changes as noted above. Electronically Signed   By: Nelson Chimes M.D.   On: 10/31/2016 14:57  Ct Cervical Spine Wo Contrast  Result Date: 11/17/2016 CLINICAL DATA:  Found on the ground unresponsive.  Cardiac arrest. EXAM: CT  HEAD WITHOUT CONTRAST CT CERVICAL SPINE WITHOUT CONTRAST TECHNIQUE: Multidetector CT imaging of the head and cervical spine was performed following the standard protocol without intravenous contrast. Multiplanar CT image reconstructions of the cervical spine were also generated. COMPARISON:  None. FINDINGS: CT HEAD FINDINGS Brain: Mild age related volume loss. No evidence of old or acute focal infarction, mass lesion, hemorrhage, hydrocephalus or extra-axial collection. Vascular: There is atherosclerotic calcification of the major vessels at the base of the brain. Skull: No skull fracture or focal lesion. Sinuses/Orbits: Chronic inflammatory changes of the paranasal sinuses on the left. Other: None significant CT CERVICAL SPINE FINDINGS Alignment: Straightening of the normal cervical lordosis. Skull base and vertebrae: No fracture or focal lesion. Soft tissues and spinal canal: No soft tissue lesion. Disc levels: C3-4: Chronic facet fusion on the right. Foraminal narrowing on the right because of osteophytic encroachment. C4-5: Chronic degenerative spondylosis with osteophytic encroachment upon the foramina bilaterally. C5-6: Distant anterior fusion. Wide patency of the canal. Foramina sufficiently patent. C6-7:  Mild spondylosis.  Mild foraminal narrowing on the left. C7-T1: Bilateral facet arthropathy with 1 mm of anterolisthesis. Upper chest: Benign scarring. Other: None significant IMPRESSION: Head CT: No acute or traumatic finding. Mild age related volume loss. Cervical spine CT: No acute or traumatic finding. Chronic degenerative and fusion changes as noted above. Electronically Signed   By: Nelson Chimes M.D.   On: 11/06/2016 14:57   Dg Chest Port 1 View  Result Date: 11/14/2016 CLINICAL DATA:  Shortness of breath. EXAM: PORTABLE CHEST 1 VIEW COMPARISON:  11/08/2016. FINDINGS: Endotracheal tube, left IJ line, NG tube in stable position. Cardiomegaly with normal pulmonary vascularity. Right lower lobe  infiltrate with small right pleural effusion. No pneumothorax . IMPRESSION: 1. Lines and tubes in stable position.  No pneumothorax. 2. Stable cardiomegaly. 3. Persistent right lower lobe infiltrate and right pleural effusion. Electronically Signed   By: Marcello Moores  Register   On: 11/14/2016 07:11   Dg Chest Port 1 View  Result Date: 10/29/2016 CLINICAL DATA:  Initial evaluation for central line placement. EXAM: PORTABLE CHEST 1 VIEW COMPARISON:  Prior radiograph from earlier the same day. FINDINGS: Left subclavian approach central venous catheter in place with tip overlying the confluence of the left brachiocephalic vein and SVC. Patient is intubated with the tip of an endotracheal tube positioned 4.8 cm above the carina. Enteric tube courses in the the abdomen. Defibrillator pad overlies left chest. Lungs normally inflated. Mild left basilar atelectasis/ scar. Right pleural effusion with associated right basilar opacity, atelectasis or infiltrate. No pulmonary edema. No pneumothorax. No acute osseus abnormality. IMPRESSION: 1. Left subclavian central venous catheter in place with tip overlying the confluence of the brachiocephalic vein/SVC. Remaining support apparatus as above. 2. Right pleural effusion with associated right basilar opacity, atelectasis and/or infiltrate. Electronically Signed   By: Jeannine Boga M.D.   On: 10/27/2016 21:27   Dg Chest Portable 1 View  Result Date: 11/10/2016 CLINICAL DATA:  Endotracheal tube placement EXAM: PORTABLE CHEST 1 VIEW COMPARISON:  07/26/2014 FINDINGS: 1413 hours. Endotracheal tube tip is 4.5 cm above the base of the carina. The NG tube passes into the stomach although the distal tip position is not included on the film. The cardio pericardial silhouette is enlarged. Interstitial markings are diffusely coarsened with chronic features. There is pulmonary vascular congestion without overt pulmonary edema. Subsegmental atelectasis noted  left base. Telemetry leads  overlie the chest. IMPRESSION: 1. Endotracheal tube tip is 4.5 cm above the base of the carina. 2. Cardiomegaly with vascular congestion and left-sided atelectasis. Electronically Signed   By: Misty Stanley M.D.   On: 11/09/2016 17:43     ASSESSMENT / PLAN:  PULMONARY A: #baseline: Smoker with polysubstance abuse including cocaine #current: Acute respiratory failure secondary to cardiac arrest  difficult airway in the ER during airway exchange 11/11/2016  P:   Get abg on current MV Hypothermia likley to need lower MV pcxr in am for rt base hazziness  CARDIOVASCULAR A:  #baseline: Chronic systolic heart failure ejection fraction 30% in 2016 #current V. fib cardiac arrest presumed due to cocaine versus MI Bradycardia from hypothermia P:  Cardiology following MAP goal > 80 Add dopamine to bata affect to assess HR if still asyptomatic brady, if remains brady in 40 and levo needs, will change to normothermia Get cortisol  RENAL  Intake/Output Summary (Last 24 hours) at 11/14/16 0848 Last data filed at 11/14/16 0800  Gross per 24 hour  Intake          4871.29 ml  Output             1465 ml  Net          3406.29 ml    Recent Labs Lab 10/26/2016 1824 11/15/2016 2012 11/04/2016 2204 11/14/16 0011 11/14/16 0404  CREATININE 1.50* 1.50* 1.60* 1.60* 1.70*     A:   #baseline: Baseline creatinine of 1.3 mg percent August 2016 #current acute kidney injury hypomag Risk cold diuresis P:   Mag supp Avoid free water Chem q4h on protocol  GASTROINTESTINAL A:    History of hepatitis C treatment deferred because of polysubstance abuse P:   Tube feeds when rewarm PPI  HEMATOLOGIC  Recent Labs Lab 11/20/2016 1352  11/14/16 0011 11/14/16 0404 11/14/16 0433  HGB 16.0  < > 15.3 14.3 14.8  HCT 45.9  < > 45.0 42.0 42.5  WBC 12.3*  --   --   --  9.2  PLT 152  --   --   --  115*  < > = values in this interval not displayed.  A:   #RBC: At risk for anemia of critical  illness  P:  -no role tx Add subq hep  INFECTIOUS  Recent Labs Lab 11/10/2016 1510 11/14/16 0433  PROCALCITON <0.10 1.82    Results for orders placed or performed during the hospital encounter of 11/15/2016  MRSA PCR Screening     Status: None   Collection Time: 11/18/2016  6:08 PM  Result Value Ref Range Status   MRSA by PCR NEGATIVE NEGATIVE Final    Comment:        The GeneXpert MRSA Assay (FDA approved for NASAL specimens only), is one component of a comprehensive MRSA colonization surveillance program. It is not intended to diagnose MRSA infection nor to guide or monitor treatment for MRSA infections.     A:   No evidence of infection P:   Anti-infectives    Start     Dose/Rate Route Frequency Ordered Stop   11/14/16 0500  vancomycin (VANCOCIN) IVPB 750 mg/150 ml premix     750 mg 150 mL/hr over 60 Minutes Intravenous Every 12 hours 11/02/2016 1448     11/06/2016 2200  piperacillin-tazobactam (ZOSYN) IVPB 3.375 g     3.375 g 12.5 mL/hr over 240 Minutes Intravenous Every 8 hours 11/08/2016 1448  10/25/2016 1445  vancomycin (VANCOCIN) 2,000 mg in sodium chloride 0.9 % 500 mL IVPB     2,000 mg 250 mL/hr over 120 Minutes Intravenous  Once 11/05/2016 1432 11/20/2016 1744   10/26/2016 1445  piperacillin-tazobactam (ZOSYN) IVPB 3.375 g     3.375 g 100 mL/hr over 30 Minutes Intravenous  Once 10/24/2016 1432 11/18/2016 1631    check RVP and HIV ( neg)   ENDOCRINE A:   At risk for hyperglycemia P:   ICU hyperglycemia protocol  NEUROLOGIC A:   #Baseline : Polysubstance abuse #Current:   Comatose deep Coma ddFollowing cardiac arrest   P:   RASS goal: -4/-5 with fentanyl and propofol infusion Induced hypothermia with Arctic sun  Keep collar on Shock brady, change prop to versed   FAMILY  - Updates: 11/14/2016 --> I updated daughter in room  Ccm time 45 min   Lavon Paganini. Titus Mould, MD, [place] Pgr: [facility]

## 2016-11-14 NOTE — Progress Notes (Signed)
   Please refer to complete cardiology consult from yesterday. The patient is status post cardiac arrest. He is currently sedated and cooled. He is receiving pressors for his hypotension. Cardiology will continue to follow as appropriate.  Daryel November, MD

## 2016-11-14 NOTE — Progress Notes (Signed)
ED sent up a patient belongings bag with patient's clothing, shoes, knee brace and wallet in a pair of jeans. Patient belongings at bedside. Will pass on to AM RN to give to patient's family, as they are expecting these items.

## 2016-11-14 NOTE — Progress Notes (Signed)
  Echocardiogram 2D Echocardiogram has been performed.  Jeffery Conrad 11/14/2016, 1:12 PM

## 2016-11-14 NOTE — Progress Notes (Signed)
Initial Nutrition Assessment  DOCUMENTATION CODES:   Not applicable  INTERVENTION:  When medically able, recommend initiate TF  - Vital 1.2 at 60 mL/h (1440 mL per day) - Providing 1728 calories, 108 grams protein and 1166 mL free water  NUTRITION DIAGNOSIS:   Inadequate oral intake related to inability to eat as evidenced by NPO status.  GOAL:   Patient will meet greater than or equal to 90% of their needs  MONITOR:   I & O's, Weight trends, Vent status, Labs  REASON FOR ASSESSMENT:   Ventilator    ASSESSMENT:   66 y.o. Male with PMH of Polysubstance abuse, Hepatitis C, Accelerated HTN; Chronic systolic CHF; CKD stage II; COPD; Hepatitis C; NICM; Non-obstructive CAD; Noncompliance; and Tobacco use disorder presenting with cardiac arrest, unknown downtime. Pt is intubated and on hypothermia protocol.   Pt is currently intubated on ventilator support MV: 8.7 L/min Temp (24hrs), Avg:92.1 F (33.4 C), Min:89.4 F (31.9 C), Max:97 F (36.1 C)  Pt has no recent weight history, pt's last weight noted a year ago on 11/02/15. No family at bedside.  Nutrition-Focused Physical Exam completed. Findings are no fat depletion, no muscle depletion, and no edema.  Labs reviewed Medications reviewed; Protonix  Diet Order:  Diet NPO time specified  Skin:  Reviewed, no issues  Last BM:  PTA  Height:   Ht Readings from Last 1 Encounters:  11/12/2016 6\' 1"  (1.854 m)    Weight:   Wt Readings from Last 1 Encounters:  11/09/2016 199 lb 4.7 oz (90.4 kg)    Ideal Body Weight:  83.6 kg   BMI:  Body mass index is 26.29 kg/m.  Estimated Nutritional Needs:   Kcal:  [id number]  Protein:  105-125 grams (1.2-1.4 g/kg)  Fluid:  >/= 2 L/d  EDUCATION NEEDS:   No education needs identified at this time  [employer] Dietetic Intern

## 2016-11-14 NOTE — Care Management Note (Signed)
Case Management Note  Patient Details  Name: Jeffery Conrad MRN: [id number] Date of Birth: December 07, 1950  Subjective/Objective:    Adm w cardiac arrest, vent                Action/Plan: lives at home, substance abuse   Expected Discharge Date:                  Expected Discharge Plan:     In-House Referral:  Clinical Social Work  Discharge planning Services  CM Consult  Post Acute Care Choice:    Choice offered to:     DME Arranged:    DME Agency:     HH Arranged:    [place] Agency:     Status of Service:  In process, will continue to follow  If discussed at Long Length of Stay Meetings, dates discussed:    Additional Comments:has da  Lacretia Leigh, RN 11/14/2016, 10:28 AM

## 2016-11-14 NOTE — Procedures (Signed)
ELECTROENCEPHALOGRAM REPORT  Date of Study: 11/14/16  Patient's Name: Jeffery Conrad MRN: [id number] Date of Birth: [birth year]  Clinical History: 66 y.o. with hx of VF arrest  Technical Summary: A multichannel digital EEG recording measured by the international 10-20 system with electrodes applied with paste and impedances below 5000 ohms performed in our laboratory with EKG monitoring in an intubated and sedated patient.  Hyperventilation and photic stimulation were not performed.  The digital EEG was referentially recorded, reformatted, and digitally filtered in a variety of bipolar and referential montages for optimal display.    Description: The patient is intubated, on cooling protocol and under sedation (propofol, nimbex, fentanyl) during the recording. There is loss of normal background activity. The records read at a sensitivity of 3 uV/mm shows diffuse suppression of background activity. There is no spontaneous reactivity. Hyperventilation and photic stimulation were not performed. There were no epileptiform discharges or electrographic seizures seen.     Impression: This EEG is abnormal due to diffuse background suppression and lack of EEG reactivity.  This can be consistent with the patients sedated state. No epileptiform activity is recorded on this tracing.  If indicated, repeat EEG when the patient is off of sedation may be of value.  Correlate clinically.

## 2016-11-14 NOTE — Progress Notes (Signed)
EEG completed; results pending.    

## 2016-11-15 ENCOUNTER — Inpatient Hospital Stay (HOSPITAL_COMMUNITY): Payer: Medicare Other

## 2016-11-15 LAB — PROCALCITONIN: PROCALCITONIN: 2.16 ng/mL

## 2016-11-15 LAB — BASIC METABOLIC PANEL
ANION GAP: 8 (ref 5–15)
Anion gap: 6 (ref 5–15)
Anion gap: 7 (ref 5–15)
Anion gap: 8 (ref 5–15)
BUN: 10 mg/dL (ref 6–20)
BUN: 11 mg/dL (ref 6–20)
BUN: 11 mg/dL (ref 6–20)
BUN: 10 mg/dL (ref 6–20)
CALCIUM: 8.3 mg/dL — AB (ref 8.9–10.3)
CHLORIDE: 110 mmol/L (ref 101–111)
CHLORIDE: 111 mmol/L (ref 101–111)
CHLORIDE: 112 mmol/L — AB (ref 101–111)
CHLORIDE: 112 mmol/L — AB (ref 101–111)
CO2: 17 mmol/L — ABNORMAL LOW (ref 22–32)
CO2: 20 mmol/L — ABNORMAL LOW (ref 22–32)
CO2: 20 mmol/L — ABNORMAL LOW (ref 22–32)
CO2: 20 mmol/L — ABNORMAL LOW (ref 22–32)
CREATININE: 2.04 mg/dL — AB (ref 0.61–1.24)
CREATININE: 2.29 mg/dL — AB (ref 0.61–1.24)
Calcium: 8.2 mg/dL — ABNORMAL LOW (ref 8.9–10.3)
Calcium: 8.3 mg/dL — ABNORMAL LOW (ref 8.9–10.3)
Calcium: 8.3 mg/dL — ABNORMAL LOW (ref 8.9–10.3)
Creatinine, Ser: 1.97 mg/dL — ABNORMAL HIGH (ref 0.61–1.24)
Creatinine, Ser: 2.01 mg/dL — ABNORMAL HIGH (ref 0.61–1.24)
GFR calc Af Amer: 37 mL/min — ABNORMAL LOW (ref >60)
GFR calc Af Amer: 38 mL/min — ABNORMAL LOW (ref >60)
GFR calc Af Amer: 38 mL/min — ABNORMAL LOW (ref >60)
GFR calc non Af Amer: 28 mL/min — ABNORMAL LOW (ref >60)
GFR calc non Af Amer: 32 mL/min — ABNORMAL LOW (ref >60)
GFR calc non Af Amer: 32 mL/min — ABNORMAL LOW (ref >60)
GFR calc non Af Amer: 33 mL/min — ABNORMAL LOW (ref >60)
GFR, EST AFRICAN AMERICAN: 32 mL/min — AB (ref >60)
GLUCOSE: 115 mg/dL — AB (ref 65–99)
GLUCOSE: 100 mg/dL — AB (ref 65–99)
Glucose, Bld: 98 mg/dL (ref 65–99)
Glucose, Bld: 87 mg/dL (ref 65–99)
POTASSIUM: 4 mmol/L (ref 3.5–5.1)
POTASSIUM: 4.1 mmol/L (ref 3.5–5.1)
Potassium: 4 mmol/L (ref 3.5–5.1)
Potassium: 4.4 mmol/L (ref 3.5–5.1)
SODIUM: 138 mmol/L (ref 135–145)
SODIUM: 138 mmol/L (ref 135–145)
Sodium: 137 mmol/L (ref 135–145)
Sodium: 138 mmol/L (ref 135–145)

## 2016-11-15 LAB — GLUCOSE, CAPILLARY
GLUCOSE-CAPILLARY: 102 mg/dL — AB (ref 65–99)
GLUCOSE-CAPILLARY: 107 mg/dL — AB (ref 65–99)
GLUCOSE-CAPILLARY: 138 mg/dL — AB (ref 65–99)
GLUCOSE-CAPILLARY: 93 mg/dL (ref 65–99)
Glucose-Capillary: 112 mg/dL — ABNORMAL HIGH (ref 65–99)
Glucose-Capillary: 79 mg/dL (ref 65–99)
Glucose-Capillary: 91 mg/dL (ref 65–99)

## 2016-11-15 LAB — BLOOD GAS, ARTERIAL
ACID-BASE DEFICIT: 5.9 mmol/L — AB (ref 0.0–2.0)
BICARBONATE: 19.3 mmol/L — AB (ref 20.0–28.0)
DRAWN BY: 25203
FIO2: 40
LHR: 14 {breaths}/min
MECHVT: 640 mL
O2 SAT: 98.1 %
PEEP/CPAP: 5 cmH2O
PH ART: 7.343 — AB (ref 7.350–7.450)
Patient temperature: 94.4
pCO2 arterial: 35.2 mmHg (ref 32.0–48.0)
pO2, Arterial: 118 mmHg — ABNORMAL HIGH (ref 83.0–108.0)

## 2016-11-15 LAB — HEPATIC FUNCTION PANEL
ALT: 66 U/L — AB (ref 17–63)
AST: 67 U/L — ABNORMAL HIGH (ref 15–41)
Albumin: 2.5 g/dL — ABNORMAL LOW (ref 3.5–5.0)
Alkaline Phosphatase: 53 U/L (ref 38–126)
BILIRUBIN DIRECT: 0.9 mg/dL — AB (ref 0.1–0.5)
BILIRUBIN INDIRECT: 1.5 mg/dL — AB (ref 0.3–0.9)
TOTAL PROTEIN: 6.4 g/dL — AB (ref 6.5–8.1)
Total Bilirubin: 2.4 mg/dL — ABNORMAL HIGH (ref 0.3–1.2)

## 2016-11-15 LAB — CBC
HCT: 45.4 % (ref 39.0–52.0)
Hemoglobin: 15.7 g/dL (ref 13.0–17.0)
MCH: 29.6 pg (ref 26.0–34.0)
MCHC: 34.6 g/dL (ref 30.0–36.0)
MCV: 85.7 fL (ref 78.0–100.0)
PLATELETS: 100 10*3/uL — AB (ref 150–400)
RBC: 5.3 MIL/uL (ref 4.22–5.81)
RDW: 12.6 % (ref 11.5–15.5)
WBC: 12.4 10*3/uL — AB (ref 4.0–10.5)

## 2016-11-15 LAB — URINE CULTURE: Culture: NO GROWTH

## 2016-11-15 LAB — PHOSPHORUS
PHOSPHORUS: 4.5 mg/dL (ref 2.5–4.6)
PHOSPHORUS: 4.5 mg/dL (ref 2.5–4.6)

## 2016-11-15 LAB — MAGNESIUM
Magnesium: 2.2 mg/dL (ref 1.7–2.4)
Magnesium: 2 mg/dL (ref 1.7–2.4)
Magnesium: 2 mg/dL (ref 1.7–2.4)

## 2016-11-15 MED ORDER — HYDROCORTISONE NA SUCCINATE PF 100 MG IJ SOLR
50.0000 mg | Freq: Four times a day (QID) | INTRAMUSCULAR | Status: DC
  Administered 2016-11-15 – 2016-11-17 (×9): 50 mg via INTRAVENOUS
  Filled 2016-11-15 (×9): qty 2

## 2016-11-15 MED ORDER — VITAL HIGH PROTEIN PO LIQD: 1000.0000 mL | ORAL | Status: DC

## 2016-11-15 MED ORDER — HEPARIN SODIUM (PORCINE) 5000 UNIT/ML IJ SOLN
5000.0000 [IU] | Freq: Three times a day (TID) | INTRAMUSCULAR | Status: DC
  Administered 2016-11-15 – 2016-11-19 (×12): 5000 [IU] via SUBCUTANEOUS
  Filled 2016-11-15 (×13): qty 1

## 2016-11-15 MED ORDER — PRO-STAT SUGAR FREE PO LIQD: 30.0000 mL | Freq: Two times a day (BID) | ORAL | Status: DC

## 2016-11-15 MED ORDER — ACETAMINOPHEN 160 MG/5ML PO SOLN
650.0000 mg | ORAL | Status: DC | PRN
  Administered 2016-11-15 – 2016-11-16 (×4): 650 mg
  Filled 2016-11-15 (×4): qty 20.3

## 2016-11-15 MED ORDER — VITAL AF 1.2 CAL PO LIQD
1000.0000 mL | ORAL | Status: DC
  Administered 2016-11-15 – 2016-11-17 (×3): 1000 mL

## 2016-11-15 NOTE — Progress Notes (Signed)
   2D Echo reveals EF 30-35%. It had been as low as 25% in the past, but had impoved up to 35-40% in 2016. Patient is still sedated as cooling is being completed. He still requires low dose pressors.  Daryel November, MD

## 2016-11-15 NOTE — Progress Notes (Signed)
Nutrition Follow-up  INTERVENTION:  - Initiate TF of Vital 1.2 at goal rate of 75 mL/hr (1800 mL per day) - Provides 2160 calories, 135 grams protein, and 1458 mL free water daily  NUTRITION DIAGNOSIS:   Inadequate oral intake related to inability to eat as evidenced by NPO status.  Ongoing  GOAL:   Patient will meet greater than or equal to 90% of their needs  Progressing  MONITOR:   I & O's, Weight trends, Vent status, Labs  REASON FOR ASSESSMENT:   Consult Enteral/tube feeding initiation and management  ASSESSMENT:   66 y.o. Male with PMH of Polysubstance abuse, Hepatitis C, Accelerated HTN; Chronic systolic CHF; CKD stage II; COPD; Hepatitis C; NICM; Non-obstructive CAD; Noncompliance; and Tobacco use disorder presenting with cardiac arrest, unknown downtime. Pt is intubated and on hypothermia protocol.  RD consulted for TF management.   Patient remains intubated on ventilator support MV: 14.2 mL/hr Temp (24hrs), Avg:93.7 F (34.3 C), Min:90 F (32.2 C), Max:100.6 F (38.1 C)  Reassessed nutritional needs based on MV and Tmax  Labs and medications reviewed  Diet Order:  Diet NPO time specified  Skin:  Reviewed, no issues  Last BM:  PTA  Height:   Ht Readings from Last 1 Encounters:  11/03/2016 6\' 1"  (1.854 m)    Weight:   Wt Readings from Last 1 Encounters:  10/30/2016 199 lb 4.7 oz (90.4 kg)    Ideal Body Weight:  83.6 kg  BMI:  Body mass index is 26.29 kg/m.  Estimated Nutritional Needs:   Kcal:  2252  Protein:  105-125 grams (1.2-1.4 g/kg)  Fluid:  >/= 2 L/d  EDUCATION NEEDS:   No education needs identified at this time  [employer] Dietetic Intern

## 2016-11-15 NOTE — Progress Notes (Signed)
Called [facility] and notified about patient's rising temperature and low water temperature.   No new orders received. Will continue to monitor patient temp and report any changes.   Laquinton Bihm E Reola Mosher, [place]

## 2016-11-15 NOTE — H&P (Signed)
PULMONARY / CRITICAL CARE MEDICINE   Name: Jeffery Conrad MRN: [id number] DOB: July 22, 1951    ADMISSION DATE:  10/28/2016 CONSULTATION DATE:  11/11/2016 Primary cardiologist Dr. Glori Bickers  REFERRING MD:  Noemi Chapel EDP  CHIEF COMPLAINT:  Cardiac arrest and coma  HISTORY OF PRESENT ILLNESS:   66 year old male known to have polysubstance abuse,  chronic systolic heart failure with ejection fraction 30% as of 2016 and also active hepatitis C but treatment was deferred because of ongoing polysubstance abuse. Last contact with healthcare providers within the health system was one year ago in March 2017. Cocaine user, VF arrest unknown downtime, cooled.  Subjective: rewarmed  VITAL SIGNS: BP (!) 116/49   Pulse 87   Temp (!) 100.6 F (38.1 C) (Core (Comment))   Resp 19   Ht 6\' 1"  (1.854 m)   Wt 90.4 kg (199 lb 4.7 oz)   SpO2 97%   BMI 26.29 kg/m   HEMODYNAMICS: CVP:  [3 mmHg-7 mmHg] 7 mmHg  VENTILATOR SETTINGS: Vent Mode: PRVC FiO2 (%):  [40 %] 40 % Set Rate:  [14 bmp] 14 bmp Vt Set:  [640 mL] 640 mL PEEP:  [5 cmH20] 5 cmH20 Plateau Pressure:  [16 cmH20-19 cmH20] 19 cmH20  INTAKE / OUTPUT: I/O last 3 completed shifts: In: 8338.9 [I.V.:7323.9; IV Piggyback:1015] Out: [id number] [Urine:3315; Emesis/NG output:200]  PHYSICAL EXAMINATION: General:  Critically looking male rewarmed Neuro:  Opens eyes to stimulation, cough presently, gag wnl, not fc HEENT:  Collar, ett Cardiovascular: s1 s2 rrr, no longer brady Lungs:  cta Abdomen:  Soft. No mass  Musculoskeletal:  Skin: Appears intact in the exposed areas  LABS:  PULMONARY  Recent Labs Lab 10/26/2016 1529  11/09/2016 2012 11/07/2016 2204 11/14/16 0011 11/14/16 0404 11/14/16 0916 11/15/16 0310  PHART 7.261*  --   --   --   --   --  7.434 7.343*  PCO2ART 46.4  --   --   --   --   --  28.5* 35.2  PO2ART 420.0*  --   --   --   --   --  98.0 118*  HCO3 20.9  --   --   --   --   --  19.9* 19.3*  TCO2 22  < > 21 20  20 21 21   --   O2SAT 100.0  --   --   --   --   --  99.0 98.1  < > = values in this interval not displayed.  CBC  Recent Labs Lab 11/15/2016 1352  11/14/16 0404 11/14/16 0433 11/15/16 0406  HGB 16.0  < > 14.3 14.8 15.7  HCT 45.9  < > 42.0 42.5 45.4  WBC 12.3*  --   --  9.2 12.4*  PLT 152  --   --  115* 100*  < > = values in this interval not displayed.  COAGULATION  Recent Labs Lab 11/12/2016 1352 10/26/2016 1510 10/30/2016 2229  INR 1.15 1.22 1.07    CARDIAC    Recent Labs Lab 10/29/2016 1510 11/07/2016 1842 11/14/16 0201 11/14/16 0745  TROPONINI 0.16* 0.89* 0.76* 0.59*   No results for input(s): PROBNP in the last 168 hours.   CHEMISTRY  Recent Labs Lab 11/14/16 0433  11/14/16 1600 11/14/16 1940 11/15/16 0005 11/15/16 0406 11/15/16 0724  NA  --   < > 138 137 138 138 138  K  --   < > 3.6 3.8 4.0 4.0 4.1  CL  --   < >  109 112* 112* 111 110  CO2  --   < > 19* 19* 20* 20* 20*  GLUCOSE  --   < > 103* 126* 115* 100* 98  BUN  --   < > 11 11 11 10 10   CREATININE  --   < > 1.91* 1.93* 2.01* 1.97* 2.04*  CALCIUM  --   < > 8.5* 8.5* 8.3* 8.2* 8.3*  MG 1.9  --   --   --   --  2.2  --   PHOS 3.5  --   --   --   --   --   --   < > = values in this interval not displayed. Estimated Creatinine Clearance: 39.2 mL/min (by C-G formula based on SCr of 2.04 mg/dL (H)).   LIVER  Recent Labs Lab 11/08/2016 1352 11/15/2016 1510 11/02/2016 2229  AST 66*  --   --   ALT 60  --   --   ALKPHOS 63  --   --   BILITOT 1.2  --   --   PROT 7.3  --   --   ALBUMIN 3.2*  --   --   INR 1.15 1.22 1.07     INFECTIOUS  Recent Labs Lab 10/27/2016 1353 10/25/2016 1510 11/06/2016 1530 10/25/2016 1842 11/14/16 0433 11/15/16 0406  LATICACIDVEN 8.17*  --  1.3 1.2  --   --   PROCALCITON  --  <0.10  --   --  1.82 2.16     ENDOCRINE CBG (last 3)   Recent Labs  11/15/16 0014 11/15/16 0405 11/15/16 0737  GLUCAP 107* 93 91         IMAGING x48h  - image(s) personally visualized  -    highlighted in bold Ct Head Wo Contrast  Result Date: 11/06/2016 CLINICAL DATA:  Found on the ground unresponsive.  Cardiac arrest. EXAM: CT HEAD WITHOUT CONTRAST CT CERVICAL SPINE WITHOUT CONTRAST TECHNIQUE: Multidetector CT imaging of the head and cervical spine was performed following the standard protocol without intravenous contrast. Multiplanar CT image reconstructions of the cervical spine were also generated. COMPARISON:  None. FINDINGS: CT HEAD FINDINGS Brain: Mild age related volume loss. No evidence of old or acute focal infarction, mass lesion, hemorrhage, hydrocephalus or extra-axial collection. Vascular: There is atherosclerotic calcification of the major vessels at the base of the brain. Skull: No skull fracture or focal lesion. Sinuses/Orbits: Chronic inflammatory changes of the paranasal sinuses on the left. Other: None significant CT CERVICAL SPINE FINDINGS Alignment: Straightening of the normal cervical lordosis. Skull base and vertebrae: No fracture or focal lesion. Soft tissues and spinal canal: No soft tissue lesion. Disc levels: C3-4: Chronic facet fusion on the right. Foraminal narrowing on the right because of osteophytic encroachment. C4-5: Chronic degenerative spondylosis with osteophytic encroachment upon the foramina bilaterally. C5-6: Distant anterior fusion. Wide patency of the canal. Foramina sufficiently patent. C6-7:  Mild spondylosis.  Mild foraminal narrowing on the left. C7-T1: Bilateral facet arthropathy with 1 mm of anterolisthesis. Upper chest: Benign scarring. Other: None significant IMPRESSION: Head CT: No acute or traumatic finding. Mild age related volume loss. Cervical spine CT: No acute or traumatic finding. Chronic degenerative and fusion changes as noted above. Electronically Signed   By: Nelson Chimes M.D.   On: 11/05/2016 14:57   Ct Cervical Spine Wo Contrast  Result Date: 10/26/2016 CLINICAL DATA:  Found on the ground unresponsive.  Cardiac arrest. EXAM: CT  HEAD WITHOUT CONTRAST CT CERVICAL SPINE WITHOUT CONTRAST TECHNIQUE:  Multidetector CT imaging of the head and cervical spine was performed following the standard protocol without intravenous contrast. Multiplanar CT image reconstructions of the cervical spine were also generated. COMPARISON:  None. FINDINGS: CT HEAD FINDINGS Brain: Mild age related volume loss. No evidence of old or acute focal infarction, mass lesion, hemorrhage, hydrocephalus or extra-axial collection. Vascular: There is atherosclerotic calcification of the major vessels at the base of the brain. Skull: No skull fracture or focal lesion. Sinuses/Orbits: Chronic inflammatory changes of the paranasal sinuses on the left. Other: None significant CT CERVICAL SPINE FINDINGS Alignment: Straightening of the normal cervical lordosis. Skull base and vertebrae: No fracture or focal lesion. Soft tissues and spinal canal: No soft tissue lesion. Disc levels: C3-4: Chronic facet fusion on the right. Foraminal narrowing on the right because of osteophytic encroachment. C4-5: Chronic degenerative spondylosis with osteophytic encroachment upon the foramina bilaterally. C5-6: Distant anterior fusion. Wide patency of the canal. Foramina sufficiently patent. C6-7:  Mild spondylosis.  Mild foraminal narrowing on the left. C7-T1: Bilateral facet arthropathy with 1 mm of anterolisthesis. Upper chest: Benign scarring. Other: None significant IMPRESSION: Head CT: No acute or traumatic finding. Mild age related volume loss. Cervical spine CT: No acute or traumatic finding. Chronic degenerative and fusion changes as noted above. Electronically Signed   By: Nelson Chimes M.D.   On: 10/25/2016 14:57   Dg Chest Port 1 View  Result Date: 11/15/2016 CLINICAL DATA:  66 year old male with acute respiratory failure and shortness of breath. EXAM: PORTABLE CHEST 1 VIEW COMPARISON:  Chest radiograph dated 11/14/2016 FINDINGS: Endotracheal tube with tip above the carina in stable  positioning. Enteric tube courses into the left hemiabdomen with tip beyond the inferior margin of the image. Left subclavian central venous line in stable positioning. Right lower lung field airspace density appears similar or slightly progressed since the prior radiograph, likely combination of small pleural effusion and associated atelectasis/ infiltrate. There is stable cardiac silhouette. No acute osseous pathology. IMPRESSION: 1. Small right pleural effusion and right lung base atelectasis/infiltrate grossly similar or minimally increased. 2. Support Line and tubes in stable position. Electronically Signed   By: Anner Crete M.D.   On: 11/15/2016 05:22   Dg Chest Port 1 View  Result Date: 11/14/2016 CLINICAL DATA:  Shortness of breath. EXAM: PORTABLE CHEST 1 VIEW COMPARISON:  11/17/2016. FINDINGS: Endotracheal tube, left IJ line, NG tube in stable position. Cardiomegaly with normal pulmonary vascularity. Right lower lobe infiltrate with small right pleural effusion. No pneumothorax . IMPRESSION: 1. Lines and tubes in stable position.  No pneumothorax. 2. Stable cardiomegaly. 3. Persistent right lower lobe infiltrate and right pleural effusion. Electronically Signed   By: Marcello Moores  Register   On: 11/14/2016 07:11   Dg Chest Port 1 View  Result Date: 10/29/2016 CLINICAL DATA:  Initial evaluation for central line placement. EXAM: PORTABLE CHEST 1 VIEW COMPARISON:  Prior radiograph from earlier the same day. FINDINGS: Left subclavian approach central venous catheter in place with tip overlying the confluence of the left brachiocephalic vein and SVC. Patient is intubated with the tip of an endotracheal tube positioned 4.8 cm above the carina. Enteric tube courses in the the abdomen. Defibrillator pad overlies left chest. Lungs normally inflated. Mild left basilar atelectasis/ scar. Right pleural effusion with associated right basilar opacity, atelectasis or infiltrate. No pulmonary edema. No pneumothorax.  No acute osseus abnormality. IMPRESSION: 1. Left subclavian central venous catheter in place with tip overlying the confluence of the brachiocephalic vein/SVC. Remaining support apparatus as  above. 2. Right pleural effusion with associated right basilar opacity, atelectasis and/or infiltrate. Electronically Signed   By: Jeannine Boga M.D.   On: 10/28/2016 21:27   Dg Chest Portable 1 View  Result Date: 10/28/2016 CLINICAL DATA:  Endotracheal tube placement EXAM: PORTABLE CHEST 1 VIEW COMPARISON:  07/26/2014 FINDINGS: 1413 hours. Endotracheal tube tip is 4.5 cm above the base of the carina. The NG tube passes into the stomach although the distal tip position is not included on the film. The cardio pericardial silhouette is enlarged. Interstitial markings are diffusely coarsened with chronic features. There is pulmonary vascular congestion without overt pulmonary edema. Subsegmental atelectasis noted left base. Telemetry leads overlie the chest. IMPRESSION: 1. Endotracheal tube tip is 4.5 cm above the base of the carina. 2. Cardiomegaly with vascular congestion and left-sided atelectasis. Electronically Signed   By: Misty Stanley M.D.   On: 11/16/2016 17:43     ASSESSMENT / PLAN:  PULMONARY A: #baseline: Smoker with polysubstance abuse including cocaine #current: Acute respiratory failure secondary to cardiac arrest  difficult airway in the ER during airway exchange 10/31/2016 ASP PNA likely P:   pcxr in am  Maintain abx ABG reviewed, keep same MV SBT attempt today planned  CARDIOVASCULAR A:  #baseline: Chronic systolic heart failure ejection fraction 30% in 2016 #current V. fib cardiac arrest presumed due to cocaine versus MI Bradycardia from hypothermia- resolved Rel AI P:  Cardiology following MAP goal > 60, levo to 4, dc dop Cortisol 3, add stress roids  RENAL  Intake/Output Summary (Last 24 hours) at 11/15/16 1023 Last data filed at 11/15/16 1000  Gross per 24 hour   Intake          4469.99 ml  Output             2170 ml  Net          2299.99 ml    Recent Labs Lab 11/14/16 1600 11/14/16 1940 11/15/16 0005 11/15/16 0406 11/15/16 0724  CREATININE 1.91* 1.93* 2.01* 1.97* 2.04*     A:   ATN, ARF, post arrest P:   Saline to 50  Avoiding free water until brain edema determined Chem in am  GASTROINTESTINAL A:    History of hepatitis C treatment deferred because of polysubstance abuse P:   Tube feeds today PPI LFT in am   HEMATOLOGIC  Recent Labs Lab 10/31/2016 1352  11/14/16 0404 11/14/16 0433 11/15/16 0406  HGB 16.0  < > 14.3 14.8 15.7  HCT 45.9  < > 42.0 42.5 45.4  WBC 12.3*  --   --  9.2 12.4*  PLT 152  --   --  115* 100*  < > = values in this interval not displayed.  A:   dvt prevention  P:  -subq hep, dc lovenox as arf -Cbc in am  INFECTIOUS  Recent Labs Lab 10/29/2016 1510 11/14/16 0433 11/15/16 0406  PROCALCITON <0.10 1.82 2.16    Results for orders placed or performed during the hospital encounter of 10/29/2016  Culture, blood (routine x 2)     Status: None (Preliminary result)   Collection Time: 10/25/2016  1:30 PM  Result Value Ref Range Status   Specimen Description BLOOD RIGHT ANTECUBITAL  Final   Special Requests BOTTLES DRAWN AEROBIC ONLY 5CC  Final   Culture NO GROWTH < 24 HOURS  Final   Report Status PENDING  Incomplete  Culture, Urine     Status: None   Collection Time: 11/02/2016  2:22 PM  Result Value Ref Range Status   Specimen Description URINE, CATHETERIZED  Final   Special Requests NONE  Final   Culture NO GROWTH  Final   Report Status 11/15/2016 FINAL  Final  Respiratory Panel by PCR     Status: None   Collection Time: 11/03/2016  2:30 PM  Result Value Ref Range Status   Adenovirus NOT DETECTED NOT DETECTED Final   Coronavirus 229E NOT DETECTED NOT DETECTED Final   Coronavirus HKU1 NOT DETECTED NOT DETECTED Final   Coronavirus NL63 NOT DETECTED NOT DETECTED Final   Coronavirus OC43 NOT  DETECTED NOT DETECTED Final   Metapneumovirus NOT DETECTED NOT DETECTED Final   Rhinovirus / Enterovirus NOT DETECTED NOT DETECTED Final   Influenza A NOT DETECTED NOT DETECTED Final   Influenza B NOT DETECTED NOT DETECTED Final   Parainfluenza Virus 1 NOT DETECTED NOT DETECTED Final   Parainfluenza Virus 2 NOT DETECTED NOT DETECTED Final   Parainfluenza Virus 3 NOT DETECTED NOT DETECTED Final   Parainfluenza Virus 4 NOT DETECTED NOT DETECTED Final   Respiratory Syncytial Virus NOT DETECTED NOT DETECTED Final   Bordetella pertussis NOT DETECTED NOT DETECTED Final   Chlamydophila pneumoniae NOT DETECTED NOT DETECTED Final   Mycoplasma pneumoniae NOT DETECTED NOT DETECTED Final  Culture, blood (routine x 2)     Status: None (Preliminary result)   Collection Time: 11/10/2016  3:30 PM  Result Value Ref Range Status   Specimen Description BLOOD LEFT WRIST  Final   Special Requests BOTTLES DRAWN AEROBIC ONLY 5CC  Final   Culture NO GROWTH < 24 HOURS  Final   Report Status PENDING  Incomplete  MRSA PCR Screening     Status: None   Collection Time: 11/07/2016  6:08 PM  Result Value Ref Range Status   MRSA by PCR NEGATIVE NEGATIVE Final    Comment:        The GeneXpert MRSA Assay (FDA approved for NASAL specimens only), is one component of a comprehensive MRSA colonization surveillance program. It is not intended to diagnose MRSA infection nor to guide or monitor treatment for MRSA infections.   Culture, respiratory (NON-Expectorated)     Status: None (Preliminary result)   Collection Time: 11/14/16  7:41 AM  Result Value Ref Range Status   Specimen Description TRACHEAL ASPIRATE  Final   Special Requests NONE  Final   Gram Stain   Final    MODERATE WBC PRESENT, PREDOMINANTLY PMN FEW GRAM POSITIVE COCCI IN CLUSTERS FEW GRAM NEGATIVE COCCOBACILLI RARE GRAM POSITIVE RODS    Culture CULTURE REINCUBATED FOR BETTER GROWTH  Final   Report Status PENDING  Incomplete    A:   Asp pna P:    Anti-infectives    Start     Dose/Rate Route Frequency Ordered Stop   11/14/16 0500  vancomycin (VANCOCIN) IVPB 750 mg/150 ml premix     750 mg 150 mL/hr over 60 Minutes Intravenous Every 12 hours 10/28/2016 1448     11/16/2016 2200  piperacillin-tazobactam (ZOSYN) IVPB 3.375 g     3.375 g 12.5 mL/hr over 240 Minutes Intravenous Every 8 hours 10/30/2016 1448     11/06/2016 1445  vancomycin (VANCOCIN) 2,000 mg in sodium chloride 0.9 % 500 mL IVPB     2,000 mg 250 mL/hr over 120 Minutes Intravenous  Once 11/10/2016 1432 10/29/2016 1744   10/26/2016 1445  piperacillin-tazobactam (ZOSYN) IVPB 3.375 g     3.375 g 100 mL/hr over 30 Minutes Intravenous  Once 11/11/2016 1432 11/18/2016  22     Vanc, zosyn, narro win am to CTX if remains culture neg HIV ( neg)  ENDOCRINE A:   At risk for hyperglycemia Rel AI P:   SSI  Stress roids, if not off pressors with this low of cortisol add florinef  NEUROLOGIC A:   #Baseline : Polysubstance abuse S/p arrest, anoxia  P:   Total WUA eeg neg focus If agitation, add fent Monitor etoh WD  FAMILY  - Updates: 11/15/2016 --> I updated daughter in room  Ccm time 35 min   Lavon Paganini. Titus Mould, MD, [place] Pgr: [facility]

## 2016-11-16 ENCOUNTER — Inpatient Hospital Stay (HOSPITAL_COMMUNITY): Payer: Medicare Other

## 2016-11-16 DIAGNOSIS — J9600 Acute respiratory failure, unspecified whether with hypoxia or hypercapnia: Secondary | ICD-10-CM

## 2016-11-16 LAB — CBC WITH DIFFERENTIAL/PLATELET
BASOS ABS: 0 10*3/uL (ref 0.0–0.1)
BASOS PCT: 0 %
EOS PCT: 0 %
Eosinophils Absolute: 0 10*3/uL (ref 0.0–0.7)
HEMATOCRIT: 40.6 % (ref 39.0–52.0)
Hemoglobin: 13.8 g/dL (ref 13.0–17.0)
LYMPHS PCT: 10 %
Lymphs Abs: 1.2 10*3/uL (ref 0.7–4.0)
MCH: 29.7 pg (ref 26.0–34.0)
MCHC: 34 g/dL (ref 30.0–36.0)
MCV: 87.3 fL (ref 78.0–100.0)
Monocytes Absolute: 0.6 10*3/uL (ref 0.1–1.0)
Monocytes Relative: 5 %
NEUTROS ABS: 10.2 10*3/uL — AB (ref 1.7–7.7)
Neutrophils Relative %: 85 %
PLATELETS: 113 10*3/uL — AB (ref 150–400)
RBC: 4.65 MIL/uL (ref 4.22–5.81)
RDW: 13.4 % (ref 11.5–15.5)
WBC: 11.9 10*3/uL — AB (ref 4.0–10.5)

## 2016-11-16 LAB — COMPREHENSIVE METABOLIC PANEL
ALT: 55 U/L (ref 17–63)
ANION GAP: 10 (ref 5–15)
AST: 65 U/L — ABNORMAL HIGH (ref 15–41)
Albumin: 2.5 g/dL — ABNORMAL LOW (ref 3.5–5.0)
Alkaline Phosphatase: 43 U/L (ref 38–126)
BILIRUBIN TOTAL: 1.1 mg/dL (ref 0.3–1.2)
BUN: 21 mg/dL — ABNORMAL HIGH (ref 6–20)
CALCIUM: 8.1 mg/dL — AB (ref 8.9–10.3)
CO2: 18 mmol/L — ABNORMAL LOW (ref 22–32)
Chloride: 110 mmol/L (ref 101–111)
Creatinine, Ser: 2.5 mg/dL — ABNORMAL HIGH (ref 0.61–1.24)
GFR, EST AFRICAN AMERICAN: 29 mL/min — AB (ref >60)
GFR, EST NON AFRICAN AMERICAN: 25 mL/min — AB (ref >60)
Glucose, Bld: 161 mg/dL — ABNORMAL HIGH (ref 65–99)
POTASSIUM: 4.9 mmol/L (ref 3.5–5.1)
Sodium: 138 mmol/L (ref 135–145)
TOTAL PROTEIN: 6.3 g/dL — AB (ref 6.5–8.1)

## 2016-11-16 LAB — GLUCOSE, CAPILLARY
GLUCOSE-CAPILLARY: 114 mg/dL — AB (ref 65–99)
GLUCOSE-CAPILLARY: 116 mg/dL — AB (ref 65–99)
GLUCOSE-CAPILLARY: 119 mg/dL — AB (ref 65–99)
GLUCOSE-CAPILLARY: 122 mg/dL — AB (ref 65–99)
GLUCOSE-CAPILLARY: 149 mg/dL — AB (ref 65–99)
GLUCOSE-CAPILLARY: 91 mg/dL (ref 65–99)

## 2016-11-16 LAB — PHOSPHORUS
PHOSPHORUS: 5.6 mg/dL — AB (ref 2.5–4.6)
Phosphorus: 4.8 mg/dL — ABNORMAL HIGH (ref 2.5–4.6)

## 2016-11-16 LAB — CULTURE, RESPIRATORY W GRAM STAIN: Culture: NORMAL

## 2016-11-16 LAB — MAGNESIUM
MAGNESIUM: 2.1 mg/dL (ref 1.7–2.4)
Magnesium: 2.3 mg/dL (ref 1.7–2.4)

## 2016-11-16 MED ORDER — SODIUM CHLORIDE 0.9 % IV SOLN: INTRAVENOUS | Status: DC

## 2016-11-16 MED ORDER — DOCUSATE SODIUM 50 MG/5ML PO LIQD: 100.0000 mg | Freq: Two times a day (BID) | ORAL | Status: DC | PRN

## 2016-11-16 MED ORDER — MIDAZOLAM HCL 2 MG/2ML IJ SOLN
1.0000 mg | INTRAMUSCULAR | Status: DC | PRN
Start: 2016-11-16 — End: 2016-11-18
  Administered 2016-11-17: 1 mg via INTRAVENOUS

## 2016-11-16 MED ORDER — MIDAZOLAM HCL 2 MG/2ML IJ SOLN
1.0000 mg | INTRAMUSCULAR | Status: DC | PRN
  Filled 2016-11-16: qty 2

## 2016-11-16 NOTE — Progress Notes (Signed)
eLink Physician-Brief Progress Note Patient Name: Jeffery Conrad DOB: 1951-02-20 MRN: [id number]   Date of Service  11/16/2016  HPI/Events of Note  Called to review KUB for gastric tube placement.  Gastic tube tip in distal stomach near the pylorus.   eICU Interventions  OK to use gastric tube.     Intervention Category Intermediate Interventions: Diagnostic test evaluation  Sommer,Steven Eugene 11/16/2016, 6:08 PM

## 2016-11-16 NOTE — Progress Notes (Signed)
Pharmacy Antibiotic Note  Jeffery Conrad is a 66 y.o. male admitted on 11/12/2016 with sepsis.  Pharmacy has been consulted for zosyn dosing. Vancomycin was discontinued today.  Plan: Zosyn 3.375gm IV Q8H (4 hr inf) F/u renal fxn, C&S, clinical status and trough at SS  Height: 6\' 1"  (185.4 cm) Weight: 213 lb 3 oz (96.7 kg) IBW/kg (Calculated) : 79.9  Temp (24hrs), Avg:98.7 F (37.1 C), Min:97.3 F (36.3 C), Max:100.6 F (38.1 C)   Recent Labs Lab 11/12/2016 1352 11/12/2016 1353  11/15/2016 1530  11/12/2016 1842  11/14/16 0433  11/15/16 0005 11/15/16 0406 11/15/16 0724 11/15/16 1142 11/16/16 0505  WBC 12.3*  --   --   --   --   --   --  9.2  --   --  12.4*  --   --  11.9*  CREATININE 1.91* 1.70*  < >  --   < >  --   < >  --   < > 2.01* 1.97* 2.04* 2.29* 2.50*  LATICACIDVEN  --  8.17*  --  1.3  --  1.2  --   --   --   --   --   --   --   --   < > = values in this interval not displayed.  Estimated Creatinine Clearance: 34.6 mL/min (by C-G formula based on SCr of 2.5 mg/dL (H)).    No Known Allergies  Antimicrobials this admission: Vanc 2/21>>2/24 Zosyn 2/21>>  Dose adjustments this admission: N/A  Microbiology results: Viral PCR 2/21 >> negative UC 2/21 >> negative  Sputum 2/22 >>  BCx2 2/21 >>   Thank you for allowing pharmacy to be a part of this patient's care.  Melburn Popper, PharmD Clinical Pharmacy Resident Pager: [phone] 11/16/16 9:08 AM

## 2016-11-16 NOTE — Progress Notes (Signed)
  Remains intubated. Agitated but not following commands.   CCM managing.   No new recs at this time from cardiology perspective.   We will see again on Monday.   Please call with questions.   Ravindra Baranek,MD 1:38 PM

## 2016-11-16 NOTE — Progress Notes (Signed)
PULMONARY / CRITICAL CARE MEDICINE   Name: Jeffery Conrad MRN: [id number] DOB: 24-May-1951    ADMISSION DATE:  10/30/2016 CONSULTATION DATE:  11/09/2016 Primary cardiologist Dr. Glori Bickers  REFERRING MD:  Noemi Chapel EDP  CHIEF COMPLAINT:  Cardiac arrest and coma  BRIEF SUMMARY:  66 year old male known to have polysubstance abuse, chronic systolic heart failure with ejection fraction 30% as of 2016 and also active hepatitis C but treatment was deferred because of ongoing polysubstance abuse. Last contact with healthcare providers within the health system was one year ago in March 2017. Cocaine user, VF arrest unknown downtime, cooled. Rewarmed 2/23.    SUBJECTIVE:  RN reports pt rewarmed, remains on 126mcg of fentanyl, Tmax 98.6  VITAL SIGNS: BP 132/74   Pulse 92   Temp 98.6 F (37 C) (Core (Comment))   Resp 10   Ht 6\' 1"  (1.854 m)   Wt 213 lb 3 oz (96.7 kg)   SpO2 98%   BMI 28.13 kg/m   HEMODYNAMICS: CVP:  [7 mmHg-12 mmHg] 12 mmHg  VENTILATOR SETTINGS: Vent Mode: PRVC FiO2 (%):  [40 %] 40 % Set Rate:  [14 bmp-16 bmp] 14 bmp Vt Set:  [640 mL] 640 mL PEEP:  [5 cmH20] 5 cmH20 Plateau Pressure:  [14 cmH20-29 cmH20] 14 cmH20  INTAKE / OUTPUT: I/O last 3 completed shifts: In: 5698.4 [I.V.:3835.9; NG/GT:1162.5; IV [id number] Out: [id number] Gaye.Gallo; Emesis/NG output:200]  PHYSICAL EXAMINATION: General: critically ill appearing male on vent, NAD  HEENT: MM pink/moist, ETT, C-Collar in place Neuro: opens eyes to voice, attempts to look at direction of voice, no follow commands CV: s1s2 rrr, no m/r/g PULM: even/non-labored, lungs bilaterally with coarse rhonchi [id number], non-tender, bsx4 active  Extremities: warm/dry, trace generalized edema  Skin: no rashes or lesions   LABS:  PULMONARY  Recent Labs Lab 11/04/2016 1529  10/31/2016 2012 11/09/2016 2204 11/14/16 0011 11/14/16 0404 11/14/16 0916 11/15/16 0310  PHART 7.261*  --   --   --   --   --  7.434  7.343*  PCO2ART 46.4  --   --   --   --   --  28.5* 35.2  PO2ART 420.0*  --   --   --   --   --  98.0 118*  HCO3 20.9  --   --   --   --   --  19.9* 19.3*  TCO2 22  < > 21 20 20 21 21   --   O2SAT 100.0  --   --   --   --   --  99.0 98.1  < > = values in this interval not displayed.  CBC  Recent Labs Lab 11/14/16 0433 11/15/16 0406 11/16/16 0505  HGB 14.8 15.7 13.8  HCT 42.5 45.4 40.6  WBC 9.2 12.4* 11.9*  PLT 115* 100* 113*    COAGULATION  Recent Labs Lab 11/12/2016 1352 11/11/2016 1510 10/27/2016 2229  INR 1.15 1.22 1.07    CARDIAC    Recent Labs Lab 11/08/2016 1510 11/14/2016 1842 11/14/16 0201 11/14/16 0745  TROPONINI 0.16* 0.89* 0.76* 0.59*   No results for input(s): PROBNP in the last 168 hours.   CHEMISTRY  Recent Labs Lab 11/14/16 0433  11/15/16 0005 11/15/16 0406 11/15/16 0724 11/15/16 1142 11/15/16 1649 11/16/16 0505  NA  --   < > 138 138 138 137  --  138  K  --   < > 4.0 4.0 4.1 4.4  --  4.9  CL  --   < >  112* 111 110 112*  --  110  CO2  --   < > 20* 20* 20* 17*  --  18*  GLUCOSE  --   < > 115* 100* 98 87  --  161*  BUN  --   < > 11 10 10 11   --  21*  CREATININE  --   < > 2.01* 1.97* 2.04* 2.29*  --  2.50*  CALCIUM  --   < > 8.3* 8.2* 8.3* 8.3*  --  8.1*  MG 1.9  --   --  2.2  --  2.0 2.0 2.1  PHOS 3.5  --   --   --   --  4.5 4.5 4.8*  < > = values in this interval not displayed. Estimated Creatinine Clearance: 34.6 mL/min (by C-G formula based on SCr of 2.5 mg/dL (H)).   LIVER  Recent Labs Lab 11/08/2016 1352 10/29/2016 1510 11/05/2016 2229 11/15/16 1142 11/16/16 0505  AST 66*  --   --  67* 65*  ALT 60  --   --  66* 55  ALKPHOS 63  --   --  53 43  BILITOT 1.2  --   --  2.4* 1.1  PROT 7.3  --   --  6.4* 6.3*  ALBUMIN 3.2*  --   --  2.5* 2.5*  INR 1.15 1.22 1.07  --   --      INFECTIOUS  Recent Labs Lab 10/26/2016 1353 11/14/2016 1510 11/16/2016 1530 10/30/2016 1842 11/14/16 0433 11/15/16 0406  LATICACIDVEN 8.17*  --  1.3 1.2  --    --   PROCALCITON  --  <0.10  --   --  1.82 2.16     ENDOCRINE CBG (last 3)   Recent Labs  11/15/16 1951 11/15/16 2359 11/16/16 0413  GLUCAP 138* 112* 149*    IMAGING x48h  - image(s) personally visualized  -   highlighted in bold Dg Chest Port 1 View  Result Date: 11/16/2016 CLINICAL DATA:  Acute respiratory failure. EXAM: PORTABLE CHEST 1 VIEW COMPARISON:  11/15/2016. FINDINGS: Endotracheal tube terminates 4.5 cm above the carina. Nasogastric tube is followed into the stomach. Left subclavian central line tip may be directed posteriorly within the azygos vein. Defibrillator pads overlie the left chest. Heart is enlarged, stable. Bibasilar airspace opacification, right greater than left, similar. There may be a tiny left pleural effusion. IMPRESSION: 1. Bibasilar airspace opacification, right greater than left, possibly due to atelectasis. Pneumonia is not excluded. 2. Possible tiny left pleural effusion. Electronically Signed   By: Lorin Picket M.D.   On: 11/16/2016 07:25   Dg Chest Port 1 View  Result Date: 11/15/2016 CLINICAL DATA:  66 year old male with acute respiratory failure and shortness of breath. EXAM: PORTABLE CHEST 1 VIEW COMPARISON:  Chest radiograph dated 11/14/2016 FINDINGS: Endotracheal tube with tip above the carina in stable positioning. Enteric tube courses into the left hemiabdomen with tip beyond the inferior margin of the image. Left subclavian central venous line in stable positioning. Right lower lung field airspace density appears similar or slightly progressed since the prior radiograph, likely combination of small pleural effusion and associated atelectasis/ infiltrate. There is stable cardiac silhouette. No acute osseous pathology. IMPRESSION: 1. Small right pleural effusion and right lung base atelectasis/infiltrate grossly similar or minimally increased. 2. Support Line and tubes in stable position. Electronically Signed   By: Anner Crete M.D.   On:  11/15/2016 05:22   SIGNIFICANT EVENTS: 2/21  Admit after VF cardiac  arrest  2/23  Re-warmed   STUDIES:  ECHO 2/22 >> LVEF 30-35%, diffuse hypokinesis, grade II diastolic dysfunction, PA peak 22  CULTURES: Viral PCR 2/21 >> negative UC 2/21 >> negative  Sputum 2/22 >>  BCx2 2/21 >>   ANTIBIOTICS: Vanco 2/21 >> 2/24 Zosyn 2/21 >>  ASSESSMENT / PLAN:  PULMONARY A: Acute Respiratory Failure in setting of Cardiac Arrest  Difficult Airway - in ER during airway exchange 2/21  Suspected Aspiration PNA Polysubstance Abuse / Smoker P:   PRVC 8 cc/kg  Wean PEEP / FiO2 for sats 90-95% Trend CXR Daily SBT / WUA See ID  CARDIOVASCULAR A:  VF Cardiac Arrest - suspect secondary to cocaine abuse vs MI Bradycardia - in setting of hypothermia protocol  Chronic Systolic CHF with EF [id number] in 2016 Adrenal Insufficiency  P:  Cardiology following, appreciate input MAP goal >65 Cortisol 3 on 2/22, continue stress steroids for now  RENAL A:   ATN, ARF, post arrest P:   NS @ 50 ml/hr Trend BMP / UOP  Replace electrolytes as indicated  Avoid nephrotoxic agents as able > consider d/c vanco in am 2/25  GASTROINTESTINAL A:   History of hepatitis C - treatment deferred because of polysubstance abuse P:   Continue TF PPI for SUP  Follow LFT's   HEMATOLOGIC A:   Thrombocytopenia  P:  Trend CBC  Monitor for bleeding  SQ Heparin for DVT prophylaxis    INFECTIOUS A: R/O HCAP P: Cultures as above  ABX as above, D4/x abx  D/C vanco  Trend PCT, fever curve / WBC  ENDOCRINE A:   At risk for hyperglycemia Rel AI P:   Continue stress steroids, cortisol 3.  Consider wean in am 2/25 SSI   NEUROLOGIC A:   Polysubstance abuse S/p arrest, anoxia - EEG negative P:   Continue normothermia  Follow serial neuro exams  Monitor for ETOH withdrawal  PRN versed for sedation  Fentanyl gtt for pain  Consider MRI brain 2/25 if not more alert  FAMILY  - Updates: No family  available am 2/24.     CC Time:  30 minutes  Noe Gens, NP-C [facility] Pgr: [phone] or if no answer [phone] 11/16/2016, 7:47 AM

## 2016-11-17 ENCOUNTER — Inpatient Hospital Stay (HOSPITAL_COMMUNITY): Payer: Medicare Other

## 2016-11-17 DIAGNOSIS — J9601 Acute respiratory failure with hypoxia: Secondary | ICD-10-CM

## 2016-11-17 DIAGNOSIS — G931 Anoxic brain damage, not elsewhere classified: Secondary | ICD-10-CM

## 2016-11-17 LAB — CBC
HEMATOCRIT: 44.8 % (ref 39.0–52.0)
Hemoglobin: 15 g/dL (ref 13.0–17.0)
MCH: 29.8 pg (ref 26.0–34.0)
MCHC: 33.5 g/dL (ref 30.0–36.0)
MCV: 88.9 fL (ref 78.0–100.0)
PLATELETS: 131 10*3/uL — AB (ref 150–400)
RBC: 5.04 MIL/uL (ref 4.22–5.81)
RDW: 13.5 % (ref 11.5–15.5)
WBC: 20 10*3/uL — ABNORMAL HIGH (ref 4.0–10.5)

## 2016-11-17 LAB — COMPREHENSIVE METABOLIC PANEL
ALT: 47 U/L (ref 17–63)
AST: 55 U/L — AB (ref 15–41)
Albumin: 2.7 g/dL — ABNORMAL LOW (ref 3.5–5.0)
Alkaline Phosphatase: 45 U/L (ref 38–126)
Anion gap: 12 (ref 5–15)
BUN: 32 mg/dL — ABNORMAL HIGH (ref 6–20)
CHLORIDE: 108 mmol/L (ref 101–111)
CO2: 21 mmol/L — AB (ref 22–32)
CREATININE: 2.16 mg/dL — AB (ref 0.61–1.24)
Calcium: 8.7 mg/dL — ABNORMAL LOW (ref 8.9–10.3)
GFR calc non Af Amer: 30 mL/min — ABNORMAL LOW (ref >60)
GFR, EST AFRICAN AMERICAN: 34 mL/min — AB (ref >60)
Glucose, Bld: 79 mg/dL (ref 65–99)
Potassium: 5.2 mmol/L — ABNORMAL HIGH (ref 3.5–5.1)
SODIUM: 141 mmol/L (ref 135–145)
Total Bilirubin: 1.6 mg/dL — ABNORMAL HIGH (ref 0.3–1.2)
Total Protein: 7.3 g/dL (ref 6.5–8.1)

## 2016-11-17 LAB — GLUCOSE, CAPILLARY
GLUCOSE-CAPILLARY: 107 mg/dL — AB (ref 65–99)
GLUCOSE-CAPILLARY: 148 mg/dL — AB (ref 65–99)
GLUCOSE-CAPILLARY: 119 mg/dL — AB (ref 65–99)
Glucose-Capillary: 141 mg/dL — ABNORMAL HIGH (ref 65–99)
Glucose-Capillary: 71 mg/dL (ref 65–99)
Glucose-Capillary: 74 mg/dL (ref 65–99)

## 2016-11-17 MED ORDER — ONDANSETRON HCL 4 MG/2ML IJ SOLN
4.0000 mg | Freq: Three times a day (TID) | INTRAMUSCULAR | Status: DC | PRN
  Administered 2016-11-19 – 2016-11-20 (×2): 4 mg via INTRAVENOUS
  Filled 2016-11-17 (×2): qty 2

## 2016-11-17 MED ORDER — ACETAMINOPHEN 160 MG/5ML PO SOLN
650.0000 mg | ORAL | Status: DC | PRN
  Administered 2016-11-17 – 2016-11-19 (×2): 650 mg
  Filled 2016-11-17 (×2): qty 20.3

## 2016-11-17 MED ORDER — AMIODARONE HCL IN DEXTROSE 360-4.14 MG/200ML-% IV SOLN
60.0000 mg/h | INTRAVENOUS | Status: AC
  Administered 2016-11-17: 60 mg/h via INTRAVENOUS
  Filled 2016-11-17: qty 200

## 2016-11-17 MED ORDER — HYDROCORTISONE NA SUCCINATE PF 100 MG IJ SOLR
50.0000 mg | Freq: Three times a day (TID) | INTRAMUSCULAR | Status: DC
  Administered 2016-11-17 – 2016-11-22 (×14): 50 mg via INTRAVENOUS
  Filled 2016-11-17 (×14): qty 2

## 2016-11-17 MED ORDER — AMIODARONE HCL IN DEXTROSE 360-4.14 MG/200ML-% IV SOLN
INTRAVENOUS | Status: AC
  Filled 2016-11-17: qty 200

## 2016-11-17 MED ORDER — AMIODARONE HCL IN DEXTROSE 360-4.14 MG/200ML-% IV SOLN
30.0000 mg/h | INTRAVENOUS | Status: DC
  Administered 2016-11-17 – 2016-11-22 (×11): 30 mg/h via INTRAVENOUS
  Filled 2016-11-17 (×10): qty 200

## 2016-11-17 MED ORDER — AMIODARONE LOAD VIA INFUSION
150.0000 mg | Freq: Once | INTRAVENOUS | Status: AC
  Administered 2016-11-17: 150 mg via INTRAVENOUS

## 2016-11-17 NOTE — Progress Notes (Signed)
Called [facility] and notified about prolonged water temperature being so low and patient trying to spike a fever. Tylenol given earlier in the shift and was not effective. New PRN tylenol orders given by [facility] MD.  Also notified about patient vomiting around ETT. Will hold tube feeds tonight and connected patient OG tube to suction.   Will continue to monitor patient.   Harkirat Orozco E Reola Mosher, [place]

## 2016-11-17 NOTE — Consult Note (Addendum)
Neurology Consult Note  Reason for Consultation: Prognosis following cardiac arrest  Requesting provider: Brand Males, MD  CC: Unable to obtain as the patient is encephalopathic and unable to provide  HPI: This is a 66 year old man who presented to the [facility] emergency department following an out-of-hospital cardiac arrest on 11/17/2016. According to notes, neighbors found the patient unresponsive in the parking lot of their apartment complex. The arrest was apparently unwitnessed. Bystanders reportedly initiated CPR. EMS was activated and arrived about 10 minutes later to find the patient still unresponsive in [place]. ACLS was initiated and he received CPR, epinephrine, and defibrillation 2. Return of spontaneous situation was reported after the first round of epinephrine was given. On arrival in the ED, he was in Afib with pulse in the 140s and blood pressure 133/85. He was intubated on arrival in the emergency department. Cardiology evaluated the patient and did not feel that he needed emergent cardiac cath. He was admitted to the ICU and initiated on the hypothermia protocol. He was rewarmed on 11/15/16. He has remained poorly responsive with some eye opening but no purposeful activity reported. Neurology consultation is now requested for assistance with prognosis following cardiac arrest.  Pertinent meds: Fentanyl drip 250 mcg/hr Fentanyl 25 q30 min prn  Zosyn 3.375 g q8h   PMH: Per chart review.  Past Medical History:  Diagnosis Date  . Accelerated hypertension   . Chronic systolic CHF (congestive heart failure) ([place])    a. 07/2014 Echo: EF 25-30%, diff HK, Gr 1 DD.  Marland Kitchen CKD (chronic kidney disease), stage II   . COPD (chronic obstructive pulmonary disease) ([place])   . Crack cocaine use   . Fracture 1976   Bilateral forearm compound fractures  . Hepatitis C    a. 07/2014 HCV AB reactive, HCV quant log: 5.7, HCV quant [id number].  Marland Kitchen NICM (nonischemic cardiomyopathy) ([place])    a. 07/2014  Echo: EF 25-30%, diff HK, Gr 1 DD.  . Non-obstructive CAD    a. 07/2014 Cath: LM nl, LAD nonobs, LCX nl, OM nl, RCA 50d, EF 20-25%  . Noncompliance   . Tobacco use disorder     PSH: Per chart review.  Past Surgical History:  Procedure Laterality Date  . CARDIAC CATHETERIZATION  07/28/14   Moderate proximal LAD disease bifurcation stenosis. Mild to moderate distal RCA disease, Heavily calcified left coronary, EF 20-25%  . FRACTURE SURGERY    . LEFT HEART CATHETERIZATION WITH CORONARY ANGIOGRAM N/A 07/27/2014   Procedure: LEFT HEART CATHETERIZATION WITH CORONARY ANGIOGRAM;  Surgeon: Sinclair Grooms, MD;  Location: [facility] CATH LAB;  Service: Cardiovascular;  Laterality: N/A;  . TONSILLECTOMY      Family history: Per chart review.  No family history on file.  Social history: Per chart review.  Social History   Social History  . Marital status: Divorced    Spouse name: N/A  . Number of children: N/A  . Years of education: N/A   Occupational History  . Not on file.   Social History Main Topics  . Smoking status: Current Every Day Smoker    Packs/day: 0.50    Years: 40.00    Types: Cigarettes  . Smokeless tobacco: Never Used     Comment: cutting back  . Alcohol use 4.2 oz/week    7 Cans of beer per week     Comment: beer  . Drug use: Yes    Types: Cocaine, "Crack" cocaine     Comment: pt states last use 04/24/15  .  Sexual activity: Yes   Other Topics Concern  . Not on file   Social History Narrative  . No narrative on file     Current inpatient meds: Medications reviewed and reconciled.  Current Facility-Administered Medications  Medication Dose Route Frequency Provider Last Rate Last Dose  . 0.9 %  sodium chloride infusion   Intravenous Continuous Donita Brooks, NP 10 mL/hr at 11/17/16 1100    . acetaminophen (TYLENOL) solution 650 mg  650 mg Per Tube Q4H PRN Colbert Coyer, MD   650 mg at 11/17/16 0252  . chlorhexidine gluconate (MEDLINE KIT) (PERIDEX) 0.12 %  solution 15 mL  15 mL Mouth Rinse BID Brand Males, MD   15 mL at 11/17/16 0807  . Chlorhexidine Gluconate Cloth 2 % PADS 6 each  6 each Topical Q0600 Brand Males, MD   6 each at 11/17/16 0603  . docusate (COLACE) 50 MG/5ML liquid 100 mg  100 mg Per Tube BID PRN Donita Brooks, NP      . feeding supplement (VITAL AF 1.2 CAL) liquid 1,000 mL  1,000 mL Per Tube Continuous Raylene Miyamoto, MD   Stopped at 11/16/16 2100  . fentaNYL (SUBLIMAZE) 2,500 mcg in sodium chloride 0.9 % 250 mL (10 mcg/mL) infusion  100-300 mcg/hr Intravenous Continuous Omar Person, NP 25 mL/hr at 11/17/16 1100 250 mcg/hr at 11/17/16 1100  . fentaNYL (SUBLIMAZE) bolus via infusion 25 mcg  25 mcg Intravenous Q30 min PRN Omar Person, NP   25 mcg at 11/15/16 1946  . heparin injection 5,000 Units  5,000 Units Subcutaneous Q8H Raylene Miyamoto, MD   5,000 Units at 11/17/16 [phone]  . hydrocortisone sodium succinate (SOLU-CORTEF) 100 MG injection 50 mg  50 mg Intravenous Q8H Brandi L Ollis, NP      . insulin aspart (novoLOG) injection 2-6 Units  2-6 Units Subcutaneous Q4H Brand Males, MD   2 Units at 11/16/16 2025  . MEDLINE mouth rinse  15 mL Mouth Rinse 10 times per day Brand Males, MD   15 mL at 11/17/16 1000  . midazolam (VERSED) injection 1 mg  1 mg Intravenous Q15 min PRN Donita Brooks, NP      . midazolam (VERSED) injection 1 mg  1 mg Intravenous Q2H PRN Donita Brooks, NP      . ondansetron (ZOFRAN) injection 4 mg  4 mg Intravenous Q8H PRN Donita Brooks, NP      . pantoprazole (PROTONIX) injection 40 mg  40 mg Intravenous QHS Omar Person, NP   40 mg at 11/16/16 2214  . piperacillin-tazobactam (ZOSYN) IVPB 3.375 g  3.375 g Intravenous Q8H Rachel L Rumbarger, RPH   3.375 g at 11/17/16 0605  . sodium chloride flush (NS) 0.9 % injection 10-40 mL  10-40 mL Intracatheter Q12H Brand Males, MD   10 mL at 11/17/16 1000  . sodium chloride flush (NS) 0.9 % injection 10-40 mL  10-40 mL  Intracatheter PRN Brand Males, MD        Allergies: No Known Allergies  ROS: As per HPI. A full 14-point review of systems could not be obtained as the patient is not able to participate.    PE:  BP 126/64   Pulse 65   Temp 98.6 F (37 C) (Core (Comment))   Resp 16   Ht '6\' 1"'  (1.854 m)   Wt 93.3 kg (205 lb 11 oz)   SpO2 99%   BMI 27.14 kg/m   General: WDWN  lying in bed in ICU. He is intubated on a fentanyl drip. He opens his eyes with verbal, tactile, and noxious stimulation. He does not fix or track. He does not follow commands. During the exam he was observed to shake his head back and forth ("no-no") and lift his head and shoulders off the bed. These were not in response to any stimulation.  HEENT: Normocephalic. He has a cervical collar in place. ETT and OGT in place. Sclerae anicteric. Mild conjunctival injection.  CV: Regular, no murmur. Distal pulses 2+ and symmetric.  Lungs: CTAB on anterior exam. Ventilated.   Abdomen: Soft, mildly distended. Bowel sounds hypoactive.  Extremities: No C/C/E. Neuro:  CN: Pupils are equal and round. They are symmetrically reactive from 3-->2 mm. He does not blink to visual threat. His eyes are conjugated, no forced deviation. Corneals are intact. His face is grossly symmetric and he appears to have a symmetric grimace. The lower part of the face is obscured by tubes and tape. He does not gag with ETT manipulation but he coughs spontaneously. The remainder of his cranial nerves cannot be accurately assessed as he is not able to participate with the exam.  Motor: Normal bulk. Tone is mildly increased throughout. No spontaneous movement of the extremities is noted. No tremor or other abnormal movements. Sensation: There is grimace with internal rotation of the upper arm to nailbed pressure in both arms. He grimaces with triple flexion to nailbed pressure in BLE. He grimaces to central pain and to supraorbital pressure bilaterally.  DTRs: 3+,  symmetric. He has bilateral crossed adductors. Toes upgoing bilaterally.  Coordination and gait: These cannot be assessed as he is not able to participate with the exam.   Labs:  Lab Results  Component Value Date   WBC 20.0 (H) 11/17/2016   HGB 15.0 11/17/2016   HCT 44.8 11/17/2016   PLT 131 (L) 11/17/2016   GLUCOSE 79 11/17/2016   CHOL 127 07/26/2014   TRIG 87 10/28/2016   HDL 43 07/26/2014   LDLCALC 74 07/26/2014   ALT 47 11/17/2016   AST 55 (H) 11/17/2016   NA 141 11/17/2016   K 5.2 (H) 11/17/2016   CL 108 11/17/2016   CREATININE 2.16 (H) 11/17/2016   BUN 32 (H) 11/17/2016   CO2 21 (L) 11/17/2016   INR 1.07 11/06/2016   HGBA1C 5.6 07/26/2014   Urine drug screen positive for cocaine and benzodiazepines on admission  Imaging:  I have personally and independently reviewed the CT scan of the head without contrast from 11/10/2016. This shows mild diffuse generalized atrophy. Mild chronic small vessel ischemic disease is also present. This involves the bihemispheric white matter. No obvious acute abnormality is noted.  Other diagnostic studies:  TTE 11/14/16: Left ventricle mildly dilated with mild concentric hypertrophy. Systolic ejection fraction 30-35 percent. Diffuse hypokinesis was noted. Grade 2 diastolic dysfunction is present. The left atrium is mildly dilated. Right atrium mildly dilated. No PFO identified.  Assessment and Plan:  1. Anoxic brain injury: This is severe, due to OOH VT/VF arrest.  He is now day #4 s/p arrest and day #2 s/p rewarming. Treatment is supportive as below.   2. Anoxic encephalopathy: This is acute, due to anoxic brain injury from cardiac arrest. The current examination shows intact brainstem reflexes and eye opening to stimulation as well as some nonpurposeful head movement. Continue with supportive care. Avoid hypoxemia and hypotension for even brief intervals as these are associated with worse neurologic outcomes. Fever and hyperglycemia must  be  aggressively treated for the same reason. MRI ordered and pending. Will order EEG for AM. Prognosis for meaningful neurologic recovery is guarded but with the head movement noted today will need to follow exam. Neurology will continue to follow the exam to aid with neurologic prognosis.   No family present at the time of my visit.   This patient is critically ill and at significant risk of neurological worsening, death and care requires constant monitoring of vital signs, hemodynamics,respiratory and cardiac monitoring, neurological assessment, discussion with family, other specialists and medical decision making of high complexity. A total of 45 minutes of critical care time was spent on this case.

## 2016-11-17 NOTE — Progress Notes (Addendum)
11mls of versed wasted in the sink in the presence of RN [employer]. An unopened bag of versed will be securely returned to pharmacy.

## 2016-11-17 NOTE — Progress Notes (Addendum)
Subjective:   Remains on vent. Failed wean again today. Will awaken at times with blank stare. Will not follow commands  Rhythm stable occasional PVCs. Brief NSVT (6-beats)     Intake/Output Summary (Last 24 hours) at 11/17/16 1534 Last data filed at 11/17/16 1100  Gross per 24 hour  Intake           1091.8 ml  Output             1475 ml  Net           -383.2 ml    Current meds: . chlorhexidine gluconate (MEDLINE KIT)  15 mL Mouth Rinse BID  . Chlorhexidine Gluconate Cloth  6 each Topical Q0600  . heparin subcutaneous  5,000 Units Subcutaneous Q8H  . hydrocortisone sodium succinate  50 mg Intravenous Q8H  . insulin aspart  2-6 Units Subcutaneous Q4H  . mouth rinse  15 mL Mouth Rinse 10 times per day  . pantoprazole (PROTONIX) IV  40 mg Intravenous QHS  . piperacillin-tazobactam (ZOSYN)  IV  3.375 g Intravenous Q8H  . sodium chloride flush  10-40 mL Intracatheter Q12H   Infusions: . sodium chloride 10 mL/hr at 11/17/16 1100  . feeding supplement (VITAL AF 1.2 CAL) Stopped (11/16/16 2100)  . fentaNYL infusion INTRAVENOUS 250 mcg/hr (11/17/16 1100)     Objective:  Blood pressure 126/64, pulse 65, temperature 98.6 F (37 C), temperature source Core (Comment), resp. rate 16, height _0  (1.854 m), weight 93.3 kg (205 lb 11 oz), SpO2 99 %. Weight change: -3.4 kg (-7 lb 7.9 oz)  Physical Exam: General:  Intubated. Mild sedation. Non-responsive in c-collar HEENT: normal with ETT Neck: C-collar  Cor: PMI nondisplaced. Regular rate & rhythm. No rubs, gallops or murmurs. Lungs: clear with mechanical breath sounds Abdomen: soft, nontender, nondistended. No hepatosplenomegaly. No bruits or masses. Good bowel sounds. Extremities: no cyanosis, clubbing, rash, trace edema Neuro: alert & orientedx3, cranial nerves grossly intact. moves all 4 extremities w/o difficulty. Affect pleasant  Telemetry: NSR with PVCs brief NSVT (6 beat). - Personally reviewed   Lab Results: Basic  Metabolic Panel:  Recent Labs Lab 11/14/16 0433  11/15/16 0406 11/15/16 0724 11/15/16 1142 11/15/16 1649 11/16/16 0505 11/16/16 1700 11/17/16 0410  NA  --   < > 138 138 137  --  138  --  141  K  --   < > 4.0 4.1 4.4  --  4.9  --  5.2*  CL  --   < > 111 110 112*  --  110  --  108  CO2  --   < > 20* 20* 17*  --  18*  --  21*  GLUCOSE  --   < > 100* 98 87  --  161*  --  79  BUN  --   < > _1 --  21*  --  32*  CREATININE  --   < > 1.97* 2.04* 2.29*  --  2.50*  --  2.16*  CALCIUM  --   < > 8.2* 8.3* 8.3*  --  8.1*  --  8.7*  MG 1.9  --  2.2  --  2.0 2.0 2.1 2.3  --   PHOS 3.5  --   --   --  4.5 4.5 4.8* 5.6*  --   < > = values in this interval not displayed. Liver Function Tests:  Recent Labs Lab 10/26/2016 1352 11/15/16 1142 11/16/16 0505 11/17/16 0410  AST  66* 67* 65* 55*  ALT 60 66* 55 47  ALKPHOS 63 53 43 45  BILITOT 1.2 2.4* 1.1 1.6*  PROT 7.3 6.4* 6.3* 7.3  ALBUMIN 3.2* 2.5* 2.5* 2.7*   No results for input(s): LIPASE, AMYLASE in the last 168 hours. No results for input(s): AMMONIA in the last 168 hours. CBC:  Recent Labs Lab 11/09/2016 1352  11/14/16 0404 11/14/16 0433 11/15/16 0406 11/16/16 0505 11/17/16 0410  WBC 12.3*  --   --  9.2 12.4* 11.9* 20.0*  NEUTROABS 6.4  --   --   --   --  10.2*  --   HGB 16.0  < > 14.3 14.8 15.7 13.8 15.0  HCT 45.9  < > 42.0 42.5 45.4 40.6 44.8  MCV 87.6  --   --  86.0 85.7 87.3 88.9  PLT 152  --   --  115* 100* 113* 131*  < > = values in this interval not displayed. Cardiac Enzymes:  Recent Labs Lab 11/01/2016 1510 11/11/2016 1842 11/14/16 0201 11/14/16 0745  TROPONINI 0.16* 0.89* 0.76* 0.59*   BNP: Invalid input(s): POCBNP CBG:  Recent Labs Lab 11/16/16 2019 11/16/16 2342 11/17/16 0350 11/17/16 0853 11/17/16 1214  GLUCAP 122* 91 71 74 107*   Microbiology: Lab Results  Component Value Date   CULT Consistent with normal respiratory flora. 11/14/2016   CULT NO GROWTH 4 DAYS 11/12/2016   CULT NO GROWTH  10/27/2016   CULT NO GROWTH 4 DAYS 10/31/2016    Recent Labs Lab 10/31/2016 1530 11/14/16 0741  CULT NO GROWTH 4 DAYS Consistent with normal respiratory flora.  SDES BLOOD LEFT WRIST TRACHEAL ASPIRATE    Imaging: Dg Chest Port 1 View  Result Date: 11/17/2016 CLINICAL DATA:  Respiratory failure with hypoxia. EXAM: PORTABLE CHEST 1 VIEW COMPARISON:  11/16/2016 FINDINGS: An endotracheal tube with tip 5.2 cm above the carina, NG tube entering the stomach with tip off the field of view and left subclavian central venous catheter with tip overlying the upper SVC again noted. Diffuse bilateral airspace opacities have increased since the prior study. There is no evidence of pneumothorax. No pleural effusions are identified. IMPRESSION: New increasing diffuse bilateral airspace opacities likely representing moderate pulmonary edema. Electronically Signed   By: Margarette Canada M.D.   On: 11/17/2016 10:20   Dg Chest Port 1 View  Result Date: 11/16/2016 CLINICAL DATA:  Acute respiratory failure. EXAM: PORTABLE CHEST 1 VIEW COMPARISON:  11/15/2016. FINDINGS: Endotracheal tube terminates 4.5 cm above the carina. Nasogastric tube is followed into the stomach. Left subclavian central line tip may be directed posteriorly within the azygos vein. Defibrillator pads overlie the left chest. Heart is enlarged, stable. Bibasilar airspace opacification, right greater than left, similar. There may be a tiny left pleural effusion. IMPRESSION: 1. Bibasilar airspace opacification, right greater than left, possibly due to atelectasis. Pneumonia is not excluded. 2. Possible tiny left pleural effusion. Electronically Signed   By: Lorin Picket M.D.   On: 11/16/2016 07:25   Dg Abd Portable 1v  Result Date: 11/16/2016 CLINICAL DATA:  OG tube placement. Hx of hypertension, CHF, chronic kidney disease, COPD, hepatitis C. Smoker. EXAM: PORTABLE ABDOMEN - 1 VIEW COMPARISON:  None. FINDINGS: Orogastric tube tip projects the distal  stomach, well positioned. There is a normal bowel gas pattern. IMPRESSION: Well-positioned orogastric tube. Tip projects in the distal stomach. Electronically Signed   By: Lajean Manes M.D.   On: 11/16/2016 19:18     ASSESSMENT:  1. VF arrest with unknown  downtime    --s/p cooling process - pads off this am 2. Acute respiratory failure   --failed vent wean 3. NICM EF 30-35%    --this is chronic 4. Anoxic brain injury 5. Polysubstance abuse 6. HCV 7. PAF - now in NSR 8. CKD stage 4 9. NSVT    PLAN/DISCUSSION:  He appears to have severe anoxic brain injury.Failed vent wean again.  Has occasional NSVT on tele but otherwise stable.  Agree with CCM that he will need further neuro prognostication. Will get forearm xray to look for metal in forearms, If no metal will get brain MRI. Neuro consulted for EEG.   Keep Mg >= 2.0 and K >= 4.0  Critical Care Time devoted to patient care services described in this note is 35 Minutes.     LOS: 4 days    Glori Bickers, MD 11/17/2016, 3:34 PM

## 2016-11-17 NOTE — Progress Notes (Signed)
PULMONARY / CRITICAL CARE MEDICINE   Name: Jeffery Conrad MRN: [id number] DOB: 06-14-1951    ADMISSION DATE:  10/24/2016 CONSULTATION DATE:  11/05/2016 Primary cardiologist Dr. Glori Bickers  REFERRING MD:  Noemi Chapel EDP  CHIEF COMPLAINT:  Cardiac arrest and coma  BRIEF SUMMARY:  66 year old male known to have polysubstance abuse, chronic systolic heart failure with ejection fraction 30% as of 2016 and also active hepatitis C but treatment was deferred because of ongoing polysubstance abuse. Last contact with healthcare providers within the health system was one year ago in March 2017. Cocaine user, VF arrest unknown downtime, cooled. Rewarmed 2/23.    SUBJECTIVE:  RN reports pt not waking, will open eyes but make no eye contact.    VITAL SIGNS: BP (!) 141/74   Pulse 77   Temp 98.6 F (37 C) (Core (Comment))   Resp 16   Ht 6\' 1"  (1.854 m)   Wt 205 lb 11 oz (93.3 kg)   SpO2 98%   BMI 27.14 kg/m   HEMODYNAMICS: CVP:  [12 mmHg-19 mmHg] 15 mmHg  VENTILATOR SETTINGS: Vent Mode: PRVC FiO2 (%):  [40 %] 40 % Set Rate:  [14 bmp] 14 bmp Vt Set:  [640 mL] 640 mL PEEP:  [5 cmH20] 5 cmH20 Plateau Pressure:  [11 cmH20-25 cmH20] 14 cmH20  INTAKE / OUTPUT: I/O last 3 completed shifts: In: 3524.7 [I.V.:1294.7; NG/GT:1830; IV [id number] Out: 3125 [Urine:1875; Emesis/NG output:1250]  PHYSICAL EXAMINATION: General:  Critically ill appearing male in NAD HEENT: MM pink/moist, ETT Neuro:  AAOx4, speech clear, MAE CV: s1s2 rrr, no m/r/g PULM: even/non-labored, lungs bilaterally coarse rhonchi  [id number], non-tender, bsx4 active  Extremities: warm/dry, trace generalized edema  Skin: no rashes or lesions    LABS:  PULMONARY  Recent Labs Lab 10/25/2016 1529  11/06/2016 2012 11/10/2016 2204 11/14/16 0011 11/14/16 0404 11/14/16 0916 11/15/16 0310  PHART 7.261*  --   --   --   --   --  7.434 7.343*  PCO2ART 46.4  --   --   --   --   --  28.5* 35.2  PO2ART 420.0*  --    --   --   --   --  98.0 118*  HCO3 20.9  --   --   --   --   --  19.9* 19.3*  TCO2 22  < > 21 20 20 21 21   --   O2SAT 100.0  --   --   --   --   --  99.0 98.1  < > = values in this interval not displayed.  CBC  Recent Labs Lab 11/15/16 0406 11/16/16 0505 11/17/16 0410  HGB 15.7 13.8 15.0  HCT 45.4 40.6 44.8  WBC 12.4* 11.9* 20.0*  PLT 100* 113* 131*    COAGULATION  Recent Labs Lab 10/24/2016 1352 11/12/2016 1510 11/06/2016 2229  INR 1.15 1.22 1.07    CARDIAC    Recent Labs Lab 11/16/2016 1510 11/18/2016 1842 11/14/16 0201 11/14/16 0745  TROPONINI 0.16* 0.89* 0.76* 0.59*   No results for input(s): PROBNP in the last 168 hours.   CHEMISTRY  Recent Labs Lab 11/14/16 0433  11/15/16 0406 11/15/16 0724 11/15/16 1142 11/15/16 1649 11/16/16 0505 11/16/16 1700 11/17/16 0410  NA  --   < > 138 138 137  --  138  --  141  K  --   < > 4.0 4.1 4.4  --  4.9  --  5.2*  CL  --   < >  111 110 112*  --  110  --  108  CO2  --   < > 20* 20* 17*  --  18*  --  21*  GLUCOSE  --   < > 100* 98 87  --  161*  --  79  BUN  --   < > 10 10 11   --  21*  --  32*  CREATININE  --   < > 1.97* 2.04* 2.29*  --  2.50*  --  2.16*  CALCIUM  --   < > 8.2* 8.3* 8.3*  --  8.1*  --  8.7*  MG 1.9  --  2.2  --  2.0 2.0 2.1 2.3  --   PHOS 3.5  --   --   --  4.5 4.5 4.8* 5.6*  --   < > = values in this interval not displayed. Estimated Creatinine Clearance: 37 mL/min (by C-G formula based on SCr of 2.16 mg/dL (H)).   LIVER  Recent Labs Lab 11/12/2016 1352 11/03/2016 1510 10/31/2016 2229 11/15/16 1142 11/16/16 0505 11/17/16 0410  AST 66*  --   --  67* 65* 55*  ALT 60  --   --  66* 55 47  ALKPHOS 63  --   --  53 43 45  BILITOT 1.2  --   --  2.4* 1.1 1.6*  PROT 7.3  --   --  6.4* 6.3* 7.3  ALBUMIN 3.2*  --   --  2.5* 2.5* 2.7*  INR 1.15 1.22 1.07  --   --   --      INFECTIOUS  Recent Labs Lab 10/27/2016 1353 11/18/2016 1510 11/05/2016 1530 11/11/2016 1842 11/14/16 0433 11/15/16 0406   LATICACIDVEN 8.17*  --  1.3 1.2  --   --   PROCALCITON  --  <0.10  --   --  1.82 2.16     ENDOCRINE CBG (last 3)   Recent Labs  11/16/16 2342 11/17/16 0350 11/17/16 0853  GLUCAP 91 71 74    IMAGING x48h  - image(s) personally visualized  -   highlighted in bold Dg Chest Port 1 View  Result Date: 11/17/2016 CLINICAL DATA:  Respiratory failure with hypoxia. EXAM: PORTABLE CHEST 1 VIEW COMPARISON:  11/16/2016 FINDINGS: An endotracheal tube with tip 5.2 cm above the carina, NG tube entering the stomach with tip off the field of view and left subclavian central venous catheter with tip overlying the upper SVC again noted. Diffuse bilateral airspace opacities have increased since the prior study. There is no evidence of pneumothorax. No pleural effusions are identified. IMPRESSION: New increasing diffuse bilateral airspace opacities likely representing moderate pulmonary edema. Electronically Signed   By: Margarette Canada M.D.   On: 11/17/2016 10:20   Dg Chest Port 1 View  Result Date: 11/16/2016 CLINICAL DATA:  Acute respiratory failure. EXAM: PORTABLE CHEST 1 VIEW COMPARISON:  11/15/2016. FINDINGS: Endotracheal tube terminates 4.5 cm above the carina. Nasogastric tube is followed into the stomach. Left subclavian central line tip may be directed posteriorly within the azygos vein. Defibrillator pads overlie the left chest. Heart is enlarged, stable. Bibasilar airspace opacification, right greater than left, similar. There may be a tiny left pleural effusion. IMPRESSION: 1. Bibasilar airspace opacification, right greater than left, possibly due to atelectasis. Pneumonia is not excluded. 2. Possible tiny left pleural effusion. Electronically Signed   By: Lorin Picket M.D.   On: 11/16/2016 07:25   Dg Abd Portable 1v  Result Date: 11/16/2016 CLINICAL DATA:  OG tube placement. Hx of hypertension, CHF, chronic kidney disease, COPD, hepatitis C. Smoker. EXAM: PORTABLE ABDOMEN - 1 VIEW COMPARISON:   None. FINDINGS: Orogastric tube tip projects the distal stomach, well positioned. There is a normal bowel gas pattern. IMPRESSION: Well-positioned orogastric tube. Tip projects in the distal stomach. Electronically Signed   By: Lajean Manes M.D.   On: 11/16/2016 19:18   SIGNIFICANT EVENTS: 2/21  Admit after VF cardiac arrest  2/23  Re-warmed  2/25  Not waking on neuro exam  STUDIES:  ECHO 2/22 >> LVEF 30-35%, diffuse hypokinesis, grade II diastolic dysfunction, PA peak 22  CULTURES: Viral PCR 2/21 >> negative UC 2/21 >> negative  Sputum 2/22 >>  BCx2 2/21 >>   ANTIBIOTICS: Vanco 2/21 >> 2/24 Zosyn 2/21 >>  ASSESSMENT / PLAN:  PULMONARY A: Acute Respiratory Failure in setting of Cardiac Arrest  Difficult Airway - in ER during airway exchange 2/21  Suspected Aspiration PNA Polysubstance Abuse / Smoker P:   PRVC 8cc/kg  Wean PEEP / FiO2 for sats > 90% CXR in am  BD's   CARDIOVASCULAR A:  VF Cardiac Arrest - suspect secondary to cocaine abuse vs MI Bradycardia - in setting of hypothermia protocol  Chronic Systolic CHF with EF [id number] in 2016 Adrenal Insufficiency  P:  Cardiology following  Continue stress dose steroids  RENAL A:   ATN, ARF, post arrest P:   NS @ 50 ml/hr Trend BMP / UOP  Replace electroltyes as indicated Avoid nephrotoxic agents as able  GASTROINTESTINAL A:   Nausea / Vomiting - 2/25 History of hepatitis C - treatment deferred because of polysubstance abuse P:   NPO  NGT to LIS  Hold TF  PRN zofran  HEMATOLOGIC A:   Thrombocytopenia  P:  Trend CBC  Monitor for bleeding  SQ heparin for DVT prophylaxis   INFECTIOUS A: R/O HCAP P: Cultures as above  ABX as above, D5/x  Trend PCT, fever curve, WBC  ENDOCRINE A:   At risk for hyperglycemia Rel AI P:   Wean stress steroids 2/25, change frequency to Q8 SSI   NEUROLOGIC A:   Polysubstance abuse S/p arrest, anoxia - EEG negative P:   D/C cooling pads > completed protocol   Follow serial neuro exams  Would ideally like to obtain MRI but family reports he has hardware in his forearms Consider repeat CT, EEG for evaluation given above Neuro consult for prognostication  PRN versed for sedation  Fentanyl gtt  FAMILY  - Updates: Family updated at bedside. They understand current clinical situation.  Daughters are appropriate / supportive but have little relationship with the patient due to prior ETOH abuse.  Celesta Gentile was not available.  They would like for everyone to be present for discussion when it comes to making decisions regarding plan of care.  They indicated he would not want to "live on artifical support" particularly if no chance of meaningful recovery.         CC Time:  30 minutes  Noe Gens, NP-C [facility] Pgr: [phone] or if no answer [phone] 11/17/2016, 11:34 AM

## 2016-11-18 ENCOUNTER — Inpatient Hospital Stay (HOSPITAL_COMMUNITY): Payer: Medicare Other

## 2016-11-18 DIAGNOSIS — Z515 Encounter for palliative care: Secondary | ICD-10-CM

## 2016-11-18 DIAGNOSIS — I481 Persistent atrial fibrillation: Secondary | ICD-10-CM

## 2016-11-18 LAB — CULTURE, BLOOD (ROUTINE X 2)
Culture: NO GROWTH
Culture: NO GROWTH

## 2016-11-18 LAB — GLUCOSE, CAPILLARY
GLUCOSE-CAPILLARY: 123 mg/dL — AB (ref 65–99)
Glucose-Capillary: 147 mg/dL — ABNORMAL HIGH (ref 65–99)
Glucose-Capillary: 135 mg/dL — ABNORMAL HIGH (ref 65–99)
Glucose-Capillary: 151 mg/dL — ABNORMAL HIGH (ref 65–99)
Glucose-Capillary: 144 mg/dL — ABNORMAL HIGH (ref 65–99)

## 2016-11-18 LAB — BASIC METABOLIC PANEL
ANION GAP: 8 (ref 5–15)
BUN: 43 mg/dL — ABNORMAL HIGH (ref 6–20)
CALCIUM: 8.8 mg/dL — AB (ref 8.9–10.3)
CHLORIDE: 110 mmol/L (ref 101–111)
CO2: 23 mmol/L (ref 22–32)
CREATININE: 2.02 mg/dL — AB (ref 0.61–1.24)
GFR calc non Af Amer: 32 mL/min — ABNORMAL LOW (ref >60)
GFR, EST AFRICAN AMERICAN: 37 mL/min — AB (ref >60)
GLUCOSE: 138 mg/dL — AB (ref 65–99)
Potassium: 4.3 mmol/L (ref 3.5–5.1)
Sodium: 141 mmol/L (ref 135–145)

## 2016-11-18 LAB — CBC
HCT: 41.3 % (ref 39.0–52.0)
Hemoglobin: 14 g/dL (ref 13.0–17.0)
MCH: 29.9 pg (ref 26.0–34.0)
MCHC: 33.9 g/dL (ref 30.0–36.0)
MCV: 88.1 fL (ref 78.0–100.0)
PLATELETS: 126 10*3/uL — AB (ref 150–400)
RBC: 4.69 MIL/uL (ref 4.22–5.81)
RDW: 13.8 % (ref 11.5–15.5)
WBC: 12.8 10*3/uL — ABNORMAL HIGH (ref 4.0–10.5)

## 2016-11-18 LAB — TRIGLYCERIDES: TRIGLYCERIDES: 148 mg/dL (ref <150)

## 2016-11-18 MED ORDER — HEPARIN (PORCINE) IN NACL 100-0.45 UNIT/ML-% IJ SOLN
INTRAMUSCULAR | Status: AC
  Filled 2016-11-18: qty 250

## 2016-11-18 MED ORDER — SODIUM CHLORIDE 0.9 % IV SOLN: 100.0000 ug/h | INTRAVENOUS | Status: DC

## 2016-11-18 MED ORDER — PROPOFOL 1000 MG/100ML IV EMUL
5.0000 ug/kg/min | INTRAVENOUS | Status: DC
  Administered 2016-11-18 – 2016-11-19 (×2): 10 ug/kg/min via INTRAVENOUS
  Administered 2016-11-19: 30 ug/kg/min via INTRAVENOUS
  Administered 2016-11-19 (×2): 20 ug/kg/min via INTRAVENOUS
  Administered 2016-11-20: 40 ug/kg/min via INTRAVENOUS
  Administered 2016-11-20: 60 ug/kg/min via INTRAVENOUS
  Administered 2016-11-20: 30 ug/kg/min via INTRAVENOUS
  Administered 2016-11-20: 60 ug/kg/min via INTRAVENOUS
  Administered 2016-11-20 (×2): 70 ug/kg/min via INTRAVENOUS
  Administered 2016-11-20: 10 ug/kg/min via INTRAVENOUS
  Administered 2016-11-21 – 2016-11-22 (×5): 30 ug/kg/min via INTRAVENOUS
  Filled 2016-11-18 (×18): qty 100

## 2016-11-18 MED ORDER — PANTOPRAZOLE SODIUM 40 MG PO PACK
40.0000 mg | PACK | Freq: Every day | ORAL | Status: DC
  Administered 2016-11-18 – 2016-11-19 (×2): 40 mg
  Filled 2016-11-18 (×3): qty 20
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
## 2016-11-18 NOTE — Progress Notes (Signed)
EEG Completed; Results Pending  

## 2016-11-18 NOTE — Progress Notes (Signed)
Subjective: No interaction, but does appear slightly more awake than described previously  Exam: Vitals:   11/18/16 1800 11/18/16 1900  BP: (!) 147/89 (!) 151/94  Pulse: (!) 113 (!) 115  Resp: 13 13  Temp: 99.1 F (37.3 C) 99.1 F (37.3 C)   Gen: In bed, Intubated Resp: Ventilated Abd: soft, nt  Neuro: MS: His eyes are open, he grimaces to noxious stimulation but does not follow commands. CN: He does not blink to threat, his pupils are equal, no disconjugate gaze Motor: He withdrawals to noxious stimulation in bilateral arms, flexion versus withdrawal and lowers  Sensory: As above  Pertinent Labs: Creatinine 2  Impression: 66 year old male status post anoxic arrest with persistent encephalopathy. There is some evidence of higher cortical function on exam (withdrawal). If he makes progress, then I would favor continued observation.   Recommendations: 1) continue to reassess daily.  Roland Rack, MD [facility] [phone]  If 7pm- 7am, please page neurology on call as listed in [place].

## 2016-11-18 NOTE — Procedures (Signed)
ELECTROENCEPHALOGRAM REPORT  Date of Study: 11/18/2016  Patient's Name: Jeffery Conrad MRN: [id number] Date of Birth: [birth year]  Referring Provider: Dr. Melba Coon  Clinical History: This is a 66 year old man s/p cardiac arrest, remains poorly responsive  Medications: fentaNYL (SUBLIMAZE) 2,500 mcg in sodium chloride 0.9 % 250 mL (10 mcg/mL) infusion  amiodarone (NEXTERONE PREMIX) 360-4.14 MG/200ML-% (1.8 mg/mL) IV infusion  insulin aspart (novoLOG) injection 2-6 Units  pantoprazole sodium (PROTONIX) 40 mg/20 mL oral suspension 40 mg  piperacillin-tazobactam (ZOSYN) IVPB 3.375 g   Technical Summary: A multichannel digital EEG recording measured by the international 10-20 system with electrodes applied with paste and impedances below 5000 ohms performed as portable with EKG monitoring in an intubated and sedated patient.  Hyperventilation and photic stimulation were not performed.  The digital EEG was referentially recorded, reformatted, and digitally filtered in a variety of bipolar and referential montages for optimal display.   Description: The patient is intubated and sedated on Fentanyl during the recording. There is no clear posterior dominant rhythm. The background consists of a large amount of diffuse 4-5 Hz theta and 2-3 Hz delta slowing. Normal sleep architecture is not seen. Hyperventilation and photic stimulation were not performed.  There were no epileptiform discharges or electrographic seizures seen.    EKG lead showed tachycardia with irregular rhythm.  Impression: This sedated EEG is abnormal due to moderate diffuse slowing of the background.  Clinical Correlation of the above findings indicates diffuse cerebral dysfunction that is non-specific in etiology and can be seen with hypoxic/ischemic injury, toxic/metabolic encephalopathies, or medication effect from Fentanyl. The absence of epileptiform discharges does not rule out a clinical diagnosis of epilepsy. If  further clinical questions remain, repeat EEG off sedation may be helpful. Clinical correlation is advised.   Ellouise Newer, M.D.
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
## 2016-11-19 ENCOUNTER — Inpatient Hospital Stay (HOSPITAL_COMMUNITY): Payer: Medicare Other

## 2016-11-19 DIAGNOSIS — I429 Cardiomyopathy, unspecified: Secondary | ICD-10-CM

## 2016-11-19 LAB — CBC
HCT: 39.9 % (ref 39.0–52.0)
HEMOGLOBIN: 13.2 g/dL (ref 13.0–17.0)
MCH: 29.2 pg (ref 26.0–34.0)
MCHC: 33.1 g/dL (ref 30.0–36.0)
MCV: 88.3 fL (ref 78.0–100.0)
Platelets: 129 10*3/uL — ABNORMAL LOW (ref 150–400)
RBC: 4.52 MIL/uL (ref 4.22–5.81)
RDW: 13.6 % (ref 11.5–15.5)
WBC: 9.3 10*3/uL (ref 4.0–10.5)

## 2016-11-19 LAB — GLUCOSE, CAPILLARY
GLUCOSE-CAPILLARY: 113 mg/dL — AB (ref 65–99)
GLUCOSE-CAPILLARY: 116 mg/dL — AB (ref 65–99)
GLUCOSE-CAPILLARY: 141 mg/dL — AB (ref 65–99)
GLUCOSE-CAPILLARY: 120 mg/dL — AB (ref 65–99)
GLUCOSE-CAPILLARY: 123 mg/dL — AB (ref 65–99)
Glucose-Capillary: 122 mg/dL — ABNORMAL HIGH (ref 65–99)
Glucose-Capillary: 164 mg/dL — ABNORMAL HIGH (ref 65–99)

## 2016-11-19 LAB — BASIC METABOLIC PANEL
ANION GAP: 8 (ref 5–15)
BUN: 45 mg/dL — ABNORMAL HIGH (ref 6–20)
CALCIUM: 8.6 mg/dL — AB (ref 8.9–10.3)
CO2: 25 mmol/L (ref 22–32)
CREATININE: 1.86 mg/dL — AB (ref 0.61–1.24)
Chloride: 112 mmol/L — ABNORMAL HIGH (ref 101–111)
GFR calc non Af Amer: 36 mL/min — ABNORMAL LOW (ref >60)
GFR, EST AFRICAN AMERICAN: 41 mL/min — AB (ref >60)
Glucose, Bld: 120 mg/dL — ABNORMAL HIGH (ref 65–99)
Potassium: 3.8 mmol/L (ref 3.5–5.1)
SODIUM: 145 mmol/L (ref 135–145)

## 2016-11-19 LAB — BLOOD GAS, ARTERIAL
Acid-base deficit: 0.7 mmol/L (ref 0.0–2.0)
Bicarbonate: 23.9 mmol/L (ref 20.0–28.0)
DRAWN BY: 419771
FIO2: 40
MECHVT: 640 mL
O2 Saturation: 97.2 %
PEEP: 5 cmH2O
Patient temperature: 98.6
RATE: 14 resp/min
pCO2 arterial: 42.9 mmHg (ref 32.0–48.0)
pH, Arterial: 7.366 (ref 7.350–7.450)
pO2, Arterial: 104 mmHg (ref 83.0–108.0)

## 2016-11-19 LAB — PHOSPHORUS: PHOSPHORUS: 2.7 mg/dL (ref 2.5–4.6)

## 2016-11-19 LAB — MAGNESIUM: MAGNESIUM: 2.6 mg/dL — AB (ref 1.7–2.4)

## 2016-11-19 LAB — HEPARIN LEVEL (UNFRACTIONATED): Heparin Unfractionated: 0.47 IU/mL (ref 0.30–0.70)

## 2016-11-19 MED ORDER — HEPARIN (PORCINE) IN NACL 100-0.45 UNIT/ML-% IJ SOLN
1100.0000 [IU]/h | INTRAMUSCULAR | Status: DC
  Administered 2016-11-19 – 2016-11-22 (×4): 1100 [IU]/h via INTRAVENOUS
  Filled 2016-11-19 (×4): qty 250

## 2016-11-19 MED ORDER — VITAL AF 1.2 CAL PO LIQD
1000.0000 mL | ORAL | Status: DC
  Administered 2016-11-19 (×2): 1000 mL

## 2016-11-19 MED ORDER — PRO-STAT SUGAR FREE PO LIQD
30.0000 mL | Freq: Two times a day (BID) | ORAL | Status: DC
  Administered 2016-11-19: 30 mL
  Filled 2016-11-19 (×2): qty 30

## 2016-11-19 NOTE — Progress Notes (Signed)
Subjective: Slightly more interactive today.   Exam: Vitals:   11/19/16 1500 11/19/16 1520  BP: 128/88   Pulse: (!) 109 95  Resp: 14 14  Temp: (!) 100.8 F (38.2 C) (!) 100.8 F (38.2 C)   Gen: In bed, Intubated Resp: Ventilated Abd: soft, nt  Neuro: MS: His eyes are open, he grimaces to noxious stimulation but does not follow commands. He actually tracks me in the room today.  CN: He does not blink to threat, his pupils are equal, no disconjugate gaze Motor: He withdrawals to noxious stimulation in all 4 ext Sensory: As above  Impression: 66 year old male status post anoxic arrest with persistent encephalopathy. He does appear to be making some progress and I would favor continued observation.   Recommendations: 1) continue to reassess daily.  Roland Rack, MD [facility] [phone]  If 7pm- 7am, please page neurology on call as listed in [place].

## 2016-11-19 NOTE — Progress Notes (Signed)
Tube feed held for patient experiencing nausea.  Nausea subsided but returned.  Zofran given x1 with some relief.
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
## 2016-11-19 NOTE — Progress Notes (Signed)
ANTICOAGULATION CONSULT NOTE - Initial Consult  Pharmacy Consult for Heparin Indication: atrial fibrillation  No Known Allergies  Patient Measurements: Height: 6\' 1"  (185.4 cm) Weight: 205 lb 11 oz (93.3 kg) IBW/kg (Calculated) : 79.9   Vital Signs: Temp: 100.8 F (38.2 C) (02/27 0900) Temp Source: Core (Comment) (02/27 0700) BP: 167/146 (02/27 0900) Pulse Rate: 88 (02/27 0900)  Labs:  Recent Labs  11/17/16 0410 11/18/16 0450 11/19/16 0400  HGB 15.0 14.0 13.2  HCT 44.8 41.3 39.9  PLT 131* 126* 129*  CREATININE 2.16* 2.02* 1.86*    Estimated Creatinine Clearance: 43 mL/min (by C-G formula based on SCr of 1.86 mg/dL (H)).   Medical History: Past Medical History:  Diagnosis Date  . Accelerated hypertension   . Chronic systolic CHF (congestive heart failure) ([place])    a. 07/2014 Echo: EF 25-30%, diff HK, Gr 1 DD.  Marland Kitchen CKD (chronic kidney disease), stage II   . COPD (chronic obstructive pulmonary disease) ([place])   . Crack cocaine use   . Fracture 1976   Bilateral forearm compound fractures  . Hepatitis C    a. 07/2014 HCV AB reactive, HCV quant log: 5.7, HCV quant [id number].  Marland Kitchen NICM (nonischemic cardiomyopathy) ([place])    a. 07/2014 Echo: EF 25-30%, diff HK, Gr 1 DD.  . Non-obstructive CAD    a. 07/2014 Cath: LM nl, LAD nonobs, LCX nl, OM nl, RCA 50d, EF 20-25%  . Noncompliance   . Tobacco use disorder       Assessment: 66yom s/p cardiac arrest, s/p hypothermia protocol, remains intubated and sedated.  New Afib cvr on amiodarone will begin IV anticoagulation with heparin drip.  CBC stable, no bleeding noted.   Goal of Therapy:  Heparin level 0.3-0.7 units/ml Monitor platelets by anticoagulation protocol: Yes   Plan:  D/ sq heparin Heparin drip 1100 uts/hr  Heparin level in 6hr from start Daily HL, CBC  Bonnita Nasuti Pharm.D. CPP, BCPS Clinical Pharmacist [phone] 11/19/2016 10:35 AM     Jeffery Conrad 11/19/2016,10:34 AM

## 2016-11-19 NOTE — Care Management Note (Signed)
Case Management Note  Patient Details  Name: SANEL STEMMER MRN: [id number] Date of Birth: 10-25-1950  Subjective/Objective:   Met @ bedside with fiance and sister - they state they are hopeful that some recovery will be possible after talking with neurologist.  Answered questions about cardiac arrest vs MI and about long-term care options if pt remains ventilated.  CM will continue to follow.                            Expected Discharge Plan:  Skilled Nursing Facility  In-House Referral:  Clinical Social Work  Discharge planning Services  CM Consult  Status of Service:  In process, will continue to follow  Girard Cooter, RN 11/19/2016, 2:19 PM

## 2016-11-19 NOTE — Progress Notes (Signed)
Advanced OG tube 3inches. BS auscultated. Increased tube feed per nutrition.

## 2016-11-19 NOTE — Progress Notes (Signed)
Nutrition Follow-up  INTERVENTION:   Recommend advance OG tube further into the stomach, spoke with RN  Decrease Vital AF 1.2 to 55 ml/hr (1320 ml/day) Add 30 ml Prostat BID Provides: 1784 kcal, 129 grams protein, 1070 ml free water.  TF regimen and propofol at current rate providing 2079 total kcal/day   NUTRITION DIAGNOSIS:   Inadequate oral intake related to inability to eat as evidenced by NPO status. Ongoing.   GOAL:   Patient will meet greater than or equal to 90% of their needs Progressing.   MONITOR:   I & O's, Weight trends, Vent status, Labs  ASSESSMENT:   Pt with PMH of Polysubstance abuse, Hepatitis C, Accelerated HTN; Chronic systolic CHF; CKD stage II; COPD; Hepatitis C; NICM; Non-obstructive CAD; Noncompliance; and Tobacco use disorder presenting with cardiac arrest, unknown downtime. Pt remains intubated and was rewarmed via hyporthermia protocol on 2/23  Patient is currently intubated on ventilator support MV: 8.1 L/min Temp (24hrs), Avg:99.9 F (37.7 C), Min:99.1 F (37.3 C), Max:100.8 F (38.2 C)  Propofol: 11.2 ml/hr = 295 kcal  OG at GE junction per 2/27 xray  Diet Order:    NPO  Skin:  Reviewed, no issues  Last BM:  2/24  Height:   Ht Readings from Last 1 Encounters:  10/25/2016 6\' 1"  (1.854 m)    Weight:   Wt Readings from Last 1 Encounters:  11/19/16 205 lb 11 oz (93.3 kg)    Ideal Body Weight:  83.6 kg  BMI:  Body mass index is 27.14 kg/m.  Estimated Nutritional Needs:   Kcal:  2051  Protein:  105-125 grams (1.2-1.4 g/kg)  Fluid:  >/= 2 L/d  EDUCATION NEEDS:   No education needs identified at this time  [place], [place], [place] Pager [phone] After Hours Pager
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
## 2016-11-19 NOTE — Progress Notes (Signed)
ANTICOAGULATION CONSULT NOTE - Follow up Pharmacy Consult for Heparin Indication: atrial fibrillation  No Known Allergies  Patient Measurements: Height: 6\' 1"  (185.4 cm) Weight: 205 lb 11 oz (93.3 kg) IBW/kg (Calculated) : 79.9   Vital Signs: Temp: 100.9 F (38.3 C) (02/27 1700) Temp Source: Core (Comment) (02/27 1600) BP: 157/98 (02/27 1700) Pulse Rate: 119 (02/27 1700)  Labs:  Recent Labs  11/17/16 0410 11/18/16 0450 11/19/16 0400 11/19/16 1727  HGB 15.0 14.0 13.2  --   HCT 44.8 41.3 39.9  --   PLT 131* 126* 129*  --   HEPARINUNFRC  --   --   --  0.47  CREATININE 2.16* 2.02* 1.86*  --     Estimated Creatinine Clearance: 43 mL/min (by C-G formula based on SCr of 1.86 mg/dL (H)).   Medical History: Past Medical History:  Diagnosis Date  . Accelerated hypertension   . Chronic systolic CHF (congestive heart failure) ([place])    a. 07/2014 Echo: EF 25-30%, diff HK, Gr 1 DD.  Marland Kitchen CKD (chronic kidney disease), stage II   . COPD (chronic obstructive pulmonary disease) ([place])   . Crack cocaine use   . Fracture 1976   Bilateral forearm compound fractures  . Hepatitis C    a. 07/2014 HCV AB reactive, HCV quant log: 5.7, HCV quant [id number].  Marland Kitchen NICM (nonischemic cardiomyopathy) ([place])    a. 07/2014 Echo: EF 25-30%, diff HK, Gr 1 DD.  . Non-obstructive CAD    a. 07/2014 Cath: LM nl, LAD nonobs, LCX nl, OM nl, RCA 50d, EF 20-25%  . Noncompliance   . Tobacco use disorder       Assessment: 66yom s/p cardiac arrest, s/p hypothermia protocol, remains intubated and sedated.  New Afib cvr on amiodarone, thus pharmacy consulted earlier today to start IV anticoagulation with heparin drip. CBC stable.   Initial 6 hour heparin level = 0.47, therapeutic on heparin drip 1100 units/hr. No bleeding noted.   Goal of Therapy:  Heparin level 0.3-0.7 units/ml Monitor platelets by anticoagulation protocol: Yes   Plan:  Continue IV Heparin drip 1100 uts/hr  Heparin level in 6hr to  confirm remains therapeutic. Daily HL, CBC  Nicole Cella, RPh Clinical Pharmacist Pager: [phone] 4P-10P [place] [phone] 11/19/2016  ,6:33 PM

## 2016-11-20 ENCOUNTER — Inpatient Hospital Stay (HOSPITAL_COMMUNITY): Payer: Medicare Other

## 2016-11-20 DIAGNOSIS — Z7189 Other specified counseling: Secondary | ICD-10-CM

## 2016-11-20 DIAGNOSIS — I482 Chronic atrial fibrillation: Secondary | ICD-10-CM

## 2016-11-20 LAB — HEPARIN LEVEL (UNFRACTIONATED)
HEPARIN UNFRACTIONATED: 0.52 [IU]/mL (ref 0.30–0.70)
Heparin Unfractionated: 0.5 IU/mL (ref 0.30–0.70)

## 2016-11-20 LAB — CBC
HEMATOCRIT: 37.8 % — AB (ref 39.0–52.0)
HEMOGLOBIN: 12.5 g/dL — AB (ref 13.0–17.0)
MCH: 29.3 pg (ref 26.0–34.0)
MCHC: 33.1 g/dL (ref 30.0–36.0)
MCV: 88.7 fL (ref 78.0–100.0)
Platelets: 134 10*3/uL — ABNORMAL LOW (ref 150–400)
RBC: 4.26 MIL/uL (ref 4.22–5.81)
RDW: 13.7 % (ref 11.5–15.5)
WBC: 7.2 10*3/uL (ref 4.0–10.5)

## 2016-11-20 LAB — GLUCOSE, CAPILLARY
GLUCOSE-CAPILLARY: 171 mg/dL — AB (ref 65–99)
GLUCOSE-CAPILLARY: 148 mg/dL — AB (ref 65–99)
GLUCOSE-CAPILLARY: 144 mg/dL — AB (ref 65–99)
GLUCOSE-CAPILLARY: 157 mg/dL — AB (ref 65–99)
Glucose-Capillary: 135 mg/dL — ABNORMAL HIGH (ref 65–99)
Glucose-Capillary: 143 mg/dL — ABNORMAL HIGH (ref 65–99)

## 2016-11-20 MED ORDER — HYDRALAZINE HCL 20 MG/ML IJ SOLN
10.0000 mg | INTRAMUSCULAR | Status: DC | PRN
  Administered 2016-11-20: 10 mg via INTRAVENOUS
  Filled 2016-11-20: qty 1

## 2016-11-20 NOTE — Progress Notes (Signed)
   11/20/16 0900  Clinical Encounter Type  Visited With Patient and family together  Visit Type Follow-up;Other (Comment)  Referral From Family;Chaplain;Nurse  Consult/Referral To Chaplain  Recommendations (follow up as needed)  Spiritual Encounters  Spiritual Needs Emotional  Stress Factors  Patient Stress Factors None identified  Family Stress Factors Family relationships;Health changes;Lack of knowledge;Major life changes  Met with patient and family, sat in with family together in conference room for consultation with physician and nurse concerning patient condition and health. Family taking time to discuss situation and will meet with physician again tomorrow to discuss decisions.    Chaplain rendered a ministry of presence, emotional support, and prayer.  Advised family members to contact [facility] if there is any further need for assistance.  Chaplain Kimia Finan A. Pearline Yerby,  MA-PC , BA-REL/PHIL, [phone]

## 2016-11-20 NOTE — Progress Notes (Signed)
Progress Note  Patient Name: Jeffery Conrad Date of Encounter: 11/20/2016  Primary Cardiologist: Dr. Haroldine Laws  Subjective   Remains in AFib with variable rate control.  HR increases when he gets agitated as sedation decreases.  CCM discussing end of life issues.  Inpatient Medications    Scheduled Meds: . chlorhexidine gluconate (MEDLINE KIT)  15 mL Mouth Rinse BID  . Chlorhexidine Gluconate Cloth  6 each Topical Q0600  . hydrocortisone sodium succinate  50 mg Intravenous Q8H  . insulin aspart  2-6 Units Subcutaneous Q4H  . mouth rinse  15 mL Mouth Rinse 10 times per day  . sodium chloride flush  10-40 mL Intracatheter Q12H   Continuous Infusions: . sodium chloride 10 mL/hr at 11/18/16 1900  . amiodarone 30 mg/hr (11/20/16 1119)  . feeding supplement (VITAL AF 1.2 CAL) 1,000 mL (11/19/16 2255)  . fentaNYL infusion INTRAVENOUS Stopped (11/19/16 0536)  . heparin 1,100 Units/hr (11/20/16 0750)  . propofol (DIPRIVAN) infusion 40 mcg/kg/min (11/20/16 1119)   PRN Meds: acetaminophen (TYLENOL) oral liquid 160 mg/5 mL, docusate, ondansetron (ZOFRAN) IV, sodium chloride flush   Vital Signs    Vitals:   11/20/16 0800 11/20/16 0816 11/20/16 0843 11/20/16 1132  BP: (!) 198/121  (!) 177/121 (!) 160/101  Pulse: (!) 122 (!) 119 (!) 121 99  Resp: (!) 21 (!) 32 (!) 32 (!) 32  Temp: 100.2 F (37.9 C) (!) 100.4 F (38 C) (!) 100.6 F (38.1 C) 100.2 F (37.9 C)  TempSrc:    Core (Comment)  SpO2: 96% 97% 97% 95%  Weight:      Height:        Intake/Output Summary (Last 24 hours) at 11/20/16 1206 Last data filed at 11/20/16 0800  Gross per 24 hour  Intake          1965.64 ml  Output             1460 ml  Net           505.64 ml   Filed Weights   11/18/16 0500 11/19/16 0402 11/20/16 0328  Weight: 206 lb 9.1 oz (93.7 kg) 205 lb 11 oz (93.3 kg) 201 lb 15.1 oz (91.6 kg)    Telemetry    AFib, RVR intermittently - Personally Reviewed  ECG    None recent  Physical Exam    GEN: Intubated, sedated  Neck: No JVD Cardiac: Irreguarly irregular, normal rate Respiratory: Clear to auscultation bilaterally. GI: Soft, nontender, non-distended  MS: No edema; No deformity. Neuro:  Nonfocal  Psych: Normal affect   Labs    Chemistry  Recent Labs Lab 11/15/16 1142 11/16/16 0505 11/17/16 0410 11/18/16 0450 11/19/16 0400  NA 137 138 141 141 145  K 4.4 4.9 5.2* 4.3 3.8  CL 112* 110 108 110 112*  CO2 17* 18* 21* 23 25  GLUCOSE 87 161* 79 138* 120*  BUN 11 21* 32* 43* 45*  CREATININE 2.29* 2.50* 2.16* 2.02* 1.86*  CALCIUM 8.3* 8.1* 8.7* 8.8* 8.6*  PROT 6.4* 6.3* 7.3  --   --   ALBUMIN 2.5* 2.5* 2.7*  --   --   AST 67* 65* 55*  --   --   ALT 66* 55 47  --   --   ALKPHOS 53 43 45  --   --   BILITOT 2.4* 1.1 1.6*  --   --   GFRNONAA 28* 25* 30* 32* 36*  GFRAA 32* 29* 34* 37* 41*  ANIONGAP 8 10 12  8 8     Hematology  Recent Labs Lab 11/18/16 0450 11/19/16 0400 11/20/16 0330  WBC 12.8* 9.3 7.2  RBC 4.69 4.52 4.26  HGB 14.0 13.2 12.5*  HCT 41.3 39.9 37.8*  MCV 88.1 88.3 88.7  MCH 29.9 29.2 29.3  MCHC 33.9 33.1 33.1  RDW 13.8 13.6 13.7  PLT 126* 129* 134*    Cardiac Enzymes  Recent Labs Lab 10/29/2016 1510 11/04/2016 1842 11/14/16 0201 11/14/16 0745  TROPONINI 0.16* 0.89* 0.76* 0.59*     Recent Labs Lab 11/03/2016 1351  TROPIPOC 0.03     BNPNo results for input(s): BNP, PROBNP in the last 168 hours.   DDimer No results for input(s): DDIMER in the last 168 hours.   Radiology    Dg Chest Port 1 View  Result Date: 11/20/2016 CLINICAL DATA:  Check endotracheal tube position EXAM: PORTABLE CHEST 1 VIEW COMPARISON:  11/19/2016 FINDINGS: Cardiomediastinal silhouette is stable. Endotracheal tube in place with tip 5.5 cm above the carina. NG tube in place unchanged in position. No infiltrate or pulmonary edema. Stable left subclavian central line position. Slight improvement in aeration. IMPRESSION: Endotracheal tube in place with tip 5.5  cm above the carina. NG tube in place unchanged in position. No infiltrate or pulmonary edema. Stable left subclavian central line position. Electronically Signed   By: Lahoma Crocker M.D.   On: 11/20/2016 08:46   Dg Chest Port 1 View  Result Date: 11/19/2016 CLINICAL DATA:  66 year old male who presented with cardiac arrest, found unresponsive. Intubated. Initial encounter. EXAM: PORTABLE CHEST 1 VIEW COMPARISON:  0440 hours today and earlier. FINDINGS: Portable AP semi upright view at 0945 hours. Endotracheal tube tip is in good position between the level the clavicles and carina. Enteric tube courses to the left upper quadrant, tip not included. Stable left subclavian central line. Stable lung volumes. Stable cardiac size and mediastinal contours. Patchy interstitial opacity in the right mid and lower lung is stable to mildly improved since earlier today. No overt pulmonary edema. No pneumothorax or pleural effusion identified on this image. IMPRESSION: 1. Endotracheal tube in good position. Enteric tube courses to the left upper quadrant, tip not included. 2. Improved bilateral ventilation since 11/18/2016. Residual patchy interstitial opacity in the right mid and lower lung with mild further improvement since this morning. Electronically Signed   By: Genevie Ann M.D.   On: 11/19/2016 11:00   Dg Chest Port 1 View  Result Date: 11/19/2016 CLINICAL DATA:  Shortness of breath. EXAM: PORTABLE CHEST 1 VIEW COMPARISON:  11/18/2016. FINDINGS: Endotracheal tube, NG tube, left subclavian line in stable position. Cardiomegaly with bilateral interstitial prominence consistent interstitial edema. Interim improvement from prior exam. Small right pleural effusion. Left costophrenic angle not imaged. No pneumothorax . IMPRESSION: 1. Lines and tubes in stable position. 2. Cardiomegaly with diffuse bilateral from interstitial prominence consistent interstitial edema. Interim improvement from prior exam. Small right pleural  effusion . Electronically Signed   By: Marcello Moores  Register   On: 11/19/2016 06:53   Dg Abd Portable 1v  Result Date: 11/19/2016 CLINICAL DATA:  Orogastric tube placement. EXAM: PORTABLE ABDOMEN - 1 VIEW COMPARISON:  Abdominal radiograph November 19, 2016 at 11:30 a.m. FINDINGS: Nasogastric tube appears looped in proximal stomach, side port past the GE junction. Included bowel gas pattern is nondilated and nonobstructive. No intra- abdominal mass effect or pathologic calcifications. Soft tissue planes and included osseous structures are unchanged. IMPRESSION: Nasogastric tube projects in proximal stomach. Electronically Signed   By: Sandie Ano  Bloomer M.D.   On: 11/19/2016 22:30   Dg Abd Portable 1v  Result Date: 11/19/2016 CLINICAL DATA:  Endotracheal and orogastric tube placements EXAM: PORTABLE ABDOMEN - 1 VIEW COMPARISON:  Portable exam 0945 hours compared 11/16/2016 FINDINGS: Tip of gastric tube projects over stomach with proximal side-port at GE junction region. Bowel gas pattern normal. Lung bases clear. Bones demineralized. IMPRESSION: Tip of gastric tube projects over proximal stomach with proximal side-port at the expected position of the gastroesophageal junction. Electronically Signed   By: Lavonia Dana M.D.   On: 11/19/2016 11:32    Cardiac Studies   2/18: EF 30-35%  Patient Profile     66 y.o. male with cardiac arrest.  Now with AFib  Assessment & Plan    AFib: Continue IV Amio for rate control.  Not on anticoagulation at this time.    Cardiomyopathy:  Diuresis as needed for vent weaning.  Improved pulmonary edema on CXR this AM.  EEG yesterday.  Neuro issues/recovery with determine further care. Discussing withdrawal at this time.  Now DNR.    Continue supportive care. Vent management per CCM.  Will sign off.  Signed, Larae Grooms, MD  11/20/2016, 12:06 PM

## 2016-11-20 NOTE — Progress Notes (Signed)
ANTICOAGULATION CONSULT NOTE - Follow Up Consult  Pharmacy Consult for heparin Indication: atrial fibrillation  Labs:  Recent Labs  11/17/16 0410 11/18/16 0450 11/19/16 0400 11/19/16 1727 11/19/16 2350  HGB 15.0 14.0 13.2  --   --   HCT 44.8 41.3 39.9  --   --   PLT 131* 126* 129*  --   --   HEPARINUNFRC  --   --   --  0.47 0.52  CREATININE 2.16* 2.02* 1.86*  --   --     Assessment/Plan:  66yo male remains therapeutic on heparin. Will continue gtt at current rate and monitor daily level.   Wynona Neat, PharmD, BCPS  11/20/2016,12:41 AM

## 2016-11-20 NOTE — Progress Notes (Signed)
ANTICOAGULATION CONSULT NOTE - Follow up Pharmacy Consult for Heparin Indication: atrial fibrillation  No Known Allergies  Patient Measurements: Height: 6\' 1"  (185.4 cm) Weight: 201 lb 15.1 oz (91.6 kg) IBW/kg (Calculated) : 79.9   Vital Signs: Temp: 100.6 F (38.1 C) (02/28 0843) Temp Source: Core (Comment) (02/28 0700) BP: 177/121 (02/28 0843) Pulse Rate: 121 (02/28 0843)  Labs:  Recent Labs  11/18/16 0450 11/19/16 0400 11/19/16 1727 11/19/16 2350 11/20/16 0330  HGB 14.0 13.2  --   --  12.5*  HCT 41.3 39.9  --   --  37.8*  PLT 126* 129*  --   --  134*  HEPARINUNFRC  --   --  0.47 0.52 0.50  CREATININE 2.02* 1.86*  --   --   --     Estimated Creatinine Clearance: 43 mL/min (by C-G formula based on SCr of 1.86 mg/dL (H)).   Medical History: Past Medical History:  Diagnosis Date  . Accelerated hypertension   . Chronic systolic CHF (congestive heart failure) ([place])    a. 07/2014 Echo: EF 25-30%, diff HK, Gr 1 DD.  Marland Kitchen CKD (chronic kidney disease), stage II   . COPD (chronic obstructive pulmonary disease) ([place])   . Crack cocaine use   . Fracture 1976   Bilateral forearm compound fractures  . Hepatitis C    a. 07/2014 HCV AB reactive, HCV quant log: 5.7, HCV quant [id number].  Marland Kitchen NICM (nonischemic cardiomyopathy) ([place])    a. 07/2014 Echo: EF 25-30%, diff HK, Gr 1 DD.  . Non-obstructive CAD    a. 07/2014 Cath: LM nl, LAD nonobs, LCX nl, OM nl, RCA 50d, EF 20-25%  . Noncompliance   . Tobacco use disorder       Assessment: 66yom s/p cardiac arrest, s/p hypothermia protocol, remains intubated and sedated.  New Afib cvr on amiodarone, thus pharmacy consulted earlier today to start IV anticoagulation with heparin drip. CBC stable.   Heparin drip 1100 uts/hr HL 0.5 at goal   Goal of Therapy:  Heparin level 0.3-0.7 units/ml Monitor platelets by anticoagulation protocol: Yes   Plan:  Continue IV Heparin drip 1100 uts/hr  Daily HL, CBC  Bonnita Nasuti Pharm.D. CPP,  BCPS Clinical Pharmacist [phone] 11/20/2016 9:01 AM

## 2016-11-20 NOTE — Progress Notes (Signed)
Subjective: No changes  Exam: Vitals:   11/20/16 0843 11/20/16 1132  BP: (!) 177/121 (!) 160/101  Pulse: (!) 121 99  Resp: (!) 32 (!) 32  Temp: (!) 100.6 F (38.1 C) 100.2 F (37.9 C)   Gen: In bed, Intubated Resp: Ventilated Abd: soft, nt  Neuro: MS: His eyes are open, he grimaces to noxious stimulation but does not follow commands. He does not track  CN: He does not blink to threat, his pupils are equal, no disconjugate gaze Motor: He withdrawals to noxious stimulation in all 4 ext Sensory: As above  Impression: 66 year old male status post anoxic arrest with persistent encephalopathy. He does appear to have made some progress. There are no definite signs of poor prognosis at this time, though I suspect that he likley has had a significant degree of injury.   Family has stated definitely no to trach/peg and made him DNR.  Recommendations: 1) continue to reassess daily.  Roland Rack, MD [facility] [phone]  If 7pm- 7am, please page neurology on call as listed in [place].

## 2016-11-20 NOTE — Progress Notes (Signed)
Spoke with daughter at her request, after discussion, decision was made to make patient a full DNR.  Will proceed with that and meet in AM regarding plan of care.  Rush Farmer, M.D. [facility] Pulmonary/Critical Care Medicine. Pager: [phone]. After hours pager: [phone].

## 2016-11-20 NOTE — Progress Notes (Addendum)
PULMONARY / CRITICAL CARE MEDICINE   Name: Jeffery Conrad MRN: [id number] DOB: 05-03-1951    ADMISSION DATE:  11/06/2016 CONSULTATION DATE:  10/31/2016 Primary cardiologist Dr. Glori Bickers  REFERRING MD:  Noemi Chapel EDP  CHIEF COMPLAINT:  Cardiac arrest and coma  BRIEF SUMMARY:  66 year old male known to have polysubstance abuse, chronic systolic heart failure with ejection fraction 30% as of 2016 and also active hepatitis C but treatment was deferred because of ongoing polysubstance abuse. Last contact with healthcare providers within the health system was one year ago in March 2017. Cocaine user, VF arrest unknown downtime, cooled. Rewarmed 2/23.    SUBJECTIVE:  Very agitated, non-purposeful, thrashing.  VITAL SIGNS: BP (!) 159/117 (BP Location: Right Arm)   Pulse (!) 110   Temp 99.9 F (37.7 C) (Core (Comment)) Comment (Src): foley  Resp 15   Ht 6\' 1"  (1.854 m)   Wt 91.6 kg (201 lb 15.1 oz)   SpO2 96%   BMI 26.64 kg/m   HEMODYNAMICS: CVP:  [5 mmHg-15 mmHg] 11 mmHg  VENTILATOR SETTINGS: Vent Mode: PRVC FiO2 (%):  [40 %] 40 % Set Rate:  [14 bmp] 14 bmp Vt Set:  [640 mL] 640 mL PEEP:  [5 cmH20] 5 cmH20 Plateau Pressure:  [17 cmH20-21 cmH20] 19 cmH20  INTAKE / OUTPUT: I/O last 3 completed shifts: In: 2738.6 [I.V.:1669.4; NG/GT:819.3; IV Piggyback:250] Out: 2255 [Urine:2255]  PHYSICAL EXAMINATION: General: Critically ill appearing male, agitated, not purposeful HEENT: MM pink/moist, ETT, pupil sluggish, no dolls eyes Neuro:  Very agitated, gagging but no response to command CV: RRR, Nl S1/S2, -M/R/G. PULM: Coarse BS diffusely GI: soft, non-tender, bsx4 active  Extremities: 1+ edema and -tenderness Skin: no rashes or lesions  LABS:  PULMONARY  Recent Labs Lab 11/02/2016 1529  11/04/2016 2012 10/24/2016 2204 11/14/16 0011 11/14/16 0404 11/14/16 0916 11/15/16 0310 11/19/16 0342  PHART 7.261*  --   --   --   --   --  7.434 7.343* 7.366  PCO2ART 46.4   --   --   --   --   --  28.5* 35.2 42.9  PO2ART 420.0*  --   --   --   --   --  98.0 118* 104  HCO3 20.9  --   --   --   --   --  19.9* 19.3* 23.9  TCO2 22  < > 21 20 20 21 21   --   --   O2SAT 100.0  --   --   --   --   --  99.0 98.1 97.2  < > = values in this interval not displayed.  CBC  Recent Labs Lab 11/18/16 0450 11/19/16 0400 11/20/16 0330  HGB 14.0 13.2 12.5*  HCT 41.3 39.9 37.8*  WBC 12.8* 9.3 7.2  PLT 126* 129* 134*   COAGULATION  Recent Labs Lab 11/18/2016 1352 11/06/2016 1510 11/09/2016 2229  INR 1.15 1.22 1.07   CARDIAC  Recent Labs Lab 11/12/2016 1510 11/12/2016 1842 11/14/16 0201 11/14/16 0745  TROPONINI 0.16* 0.89* 0.76* 0.59*   No results for input(s): PROBNP in the last 168 hours.  CHEMISTRY  Recent Labs Lab 11/15/16 1142 11/15/16 1649 11/16/16 0505 11/16/16 1700 11/17/16 0410 11/18/16 0450 11/19/16 0400  NA 137  --  138  --  141 141 145  K 4.4  --  4.9  --  5.2* 4.3 3.8  CL 112*  --  110  --  108 110 112*  CO2 17*  --  18*  --  21* 23 25  GLUCOSE 87  --  161*  --  79 138* 120*  BUN 11  --  21*  --  32* 43* 45*  CREATININE 2.29*  --  2.50*  --  2.16* 2.02* 1.86*  CALCIUM 8.3*  --  8.1*  --  8.7* 8.8* 8.6*  MG 2.0 2.0 2.1 2.3  --   --  2.6*  PHOS 4.5 4.5 4.8* 5.6*  --   --  2.7   Estimated Creatinine Clearance: 43 mL/min (by C-G formula based on SCr of 1.86 mg/dL (H)).  LIVER  Recent Labs Lab 11/18/2016 1352 10/25/2016 1510 11/12/2016 2229 11/15/16 1142 11/16/16 0505 11/17/16 0410  AST 66*  --   --  67* 65* 55*  ALT 60  --   --  66* 55 47  ALKPHOS 63  --   --  53 43 45  BILITOT 1.2  --   --  2.4* 1.1 1.6*  PROT 7.3  --   --  6.4* 6.3* 7.3  ALBUMIN 3.2*  --   --  2.5* 2.5* 2.7*  INR 1.15 1.22 1.07  --   --   --    INFECTIOUS  Recent Labs Lab 11/08/2016 1353 10/28/2016 1510 11/18/2016 1530 10/28/2016 1842 11/14/16 0433 11/15/16 0406  LATICACIDVEN 8.17*  --  1.3 1.2  --   --   PROCALCITON  --  <0.10  --   --  1.82 2.16    ENDOCRINE CBG (last 3)   Recent Labs  11/19/16 2341 11/20/16 0310 11/20/16 0754  GLUCAP 164* 135* 171*   IMAGING x48h  - image(s) personally visualized  -   highlighted in bold Dg Chest Port 1 View  Result Date: 11/19/2016 CLINICAL DATA:  66 year old male who presented with cardiac arrest, found unresponsive. Intubated. Initial encounter. EXAM: PORTABLE CHEST 1 VIEW COMPARISON:  0440 hours today and earlier. FINDINGS: Portable AP semi upright view at 0945 hours. Endotracheal tube tip is in good position between the level the clavicles and carina. Enteric tube courses to the left upper quadrant, tip not included. Stable left subclavian central line. Stable lung volumes. Stable cardiac size and mediastinal contours. Patchy interstitial opacity in the right mid and lower lung is stable to mildly improved since earlier today. No overt pulmonary edema. No pneumothorax or pleural effusion identified on this image. IMPRESSION: 1. Endotracheal tube in good position. Enteric tube courses to the left upper quadrant, tip not included. 2. Improved bilateral ventilation since 11/18/2016. Residual patchy interstitial opacity in the right mid and lower lung with mild further improvement since this morning. Electronically Signed   By: Genevie Ann M.D.   On: 11/19/2016 11:00   Dg Chest Port 1 View  Result Date: 11/19/2016 CLINICAL DATA:  Shortness of breath. EXAM: PORTABLE CHEST 1 VIEW COMPARISON:  11/18/2016. FINDINGS: Endotracheal tube, NG tube, left subclavian line in stable position. Cardiomegaly with bilateral interstitial prominence consistent interstitial edema. Interim improvement from prior exam. Small right pleural effusion. Left costophrenic angle not imaged. No pneumothorax . IMPRESSION: 1. Lines and tubes in stable position. 2. Cardiomegaly with diffuse bilateral from interstitial prominence consistent interstitial edema. Interim improvement from prior exam. Small right pleural effusion . Electronically  Signed   By: Marcello Moores  Register   On: 11/19/2016 06:53   Dg Abd Portable 1v  Result Date: 11/19/2016 CLINICAL DATA:  Orogastric tube placement. EXAM: PORTABLE ABDOMEN - 1 VIEW COMPARISON:  Abdominal radiograph November 19, 2016 at 11:30 a.m. FINDINGS:  Nasogastric tube appears looped in proximal stomach, side port past the GE junction. Included bowel gas pattern is nondilated and nonobstructive. No intra- abdominal mass effect or pathologic calcifications. Soft tissue planes and included osseous structures are unchanged. IMPRESSION: Nasogastric tube projects in proximal stomach. Electronically Signed   By: Elon Alas M.D.   On: 11/19/2016 22:30   Dg Abd Portable 1v  Result Date: 11/19/2016 CLINICAL DATA:  Endotracheal and orogastric tube placements EXAM: PORTABLE ABDOMEN - 1 VIEW COMPARISON:  Portable exam 0945 hours compared 11/16/2016 FINDINGS: Tip of gastric tube projects over stomach with proximal side-port at GE junction region. Bowel gas pattern normal. Lung bases clear. Bones demineralized. IMPRESSION: Tip of gastric tube projects over proximal stomach with proximal side-port at the expected position of the gastroesophageal junction. Electronically Signed   By: Lavonia Dana M.D.   On: 11/19/2016 11:32   SIGNIFICANT EVENTS: 2/21  Admit after VF cardiac arrest  2/23  Re-warmed  2/25  Not waking on neuro exam  STUDIES:  ECHO 2/22 >> LVEF 30-35%, diffuse hypokinesis, grade II diastolic dysfunction, PA peak 22  CULTURES: Viral PCR 2/21 >> negative UC 2/21 >> negative  Sputum 2/22 >> Polymicrobial BCx2 2/21 >> NTD  ANTIBIOTICS: Vanco 2/21 >> 2/24 Zosyn 2/21 >>2/28  ASSESSMENT / PLAN:  PULMONARY A: Acute Respiratory Failure in setting of Cardiac Arrest  Difficult Airway - in ER during airway exchange 2/21  Suspected Aspiration PNA Polysubstance Abuse / Smoker P:   PRVC Wean PEEP/FiO2 for sats > 88% CXR and ABG in AM Adjust vent to ABG CXR now to verify ETT position. BD's   Hold weaning given mental status and family meeting today.  CARDIOVASCULAR A:  VF Cardiac Arrest - suspect secondary to cocaine abuse vs MI Bradycardia - in setting of hypothermia protocol  Chronic Systolic CHF with EF [id number] in 2016 Adrenal Insufficiency  P:  Cardiology following  Continue stress dose steroids D/C Vasopressors Heparin full dose for a-fib  RENAL A:   ATN, ARF, post arrest P:   KVO IVF Trend BMP / UOP  Replace electrolytes as indicated Avoid nephrotoxic agents as able BMET in AM BMET now  GASTROINTESTINAL A:   Nausea / Vomiting - 2/25 History of hepatitis C - treatment deferred because of polysubstance abuse P:   TF per nutrition PRN zofran  HEMATOLOGIC A:   Thrombocytopenia  P:  Trend CBC  Monitor for bleeding  SQ heparin for DVT prophylaxis   INFECTIOUS A: R/O HCAP P: Cultures as above  ABX as above, D8/8  PCT 2.16  ENDOCRINE A:   At risk for hyperglycemia Rel AI P:   Wean stress steroids 2/25, change frequency to Q8 SSI   NEUROLOGIC A:   Polysubstance abuse S/p arrest, anoxia - EEG negative P:   Hypothermia protocol completed Would ideally like to obtain MRI but family reports he has hardware in his forearms, verified by X-ray. EEG per neuro Defer CT to neurology Neuro consult for prognostication - poor, neurology favors continued observation. D/C versed D/C fentanyl drip Propofol drip and PRN fentanyl  FAMILY  - Updates: Family to arrive today for family meeting today.  The patient is critically ill with multiple organ systems failure and requires high complexity decision making for assessment and support, frequent evaluation and titration of therapies, application of advanced monitoring technologies and extensive interpretation of multiple databases.   Critical Care Time devoted to patient care services described in this note is  45  Minutes. This time reflects  time of care of this signee Dr Jennet Maduro. This critical  care time does not reflect procedure time, or teaching time or supervisory time of PA/NP/Med student/Med Resident etc but could involve care discussion time.  Rush Farmer, M.D. [facility] Pulmonary/Critical Care Medicine. Pager: [phone]. After hours pager: [phone].  11/20/2016, 8:14 AM

## 2016-11-20 NOTE — Progress Notes (Signed)
Met with patient's entire family, explained the entire case again as there were new faces that did not know anything about patient's condition.  After discussion, they informed me that he is unlikely to would want a trach and PEG.  I was very direct that neurology feels that more time is needed to prognosticate but today is day 8 and we would be talking about trach/peg by the end of the week if we are to give more time.  They were very concerned because the patient would not want that.  At the end of the discussion, they asked for more time to speak as a family and they would inform us tomorrow through his daughter as the spokes person.  I also asked them to discuss code status.  Will need further discussions.  The patient is critically ill with multiple organ systems failure and requires high complexity decision making for assessment and support, frequent evaluation and titration of therapies, application of advanced monitoring technologies and extensive interpretation of multiple databases.   Critical Care Time devoted to patient care services described in this note is  45  Minutes. This time reflects time of care of this signee Dr Jennet Maduro. This critical care time does not reflect procedure time, or teaching time or supervisory time of PA/NP/Med student/Med Resident etc but could involve care discussion time.  Rush Farmer, M.D. [facility] Pulmonary/Critical Care Medicine. Pager: [phone]. After hours pager: [phone].

## 2016-11-21 ENCOUNTER — Inpatient Hospital Stay (HOSPITAL_COMMUNITY): Payer: Medicare Other

## 2016-11-21 LAB — BASIC METABOLIC PANEL
ANION GAP: 9 (ref 5–15)
BUN: 52 mg/dL — ABNORMAL HIGH (ref 6–20)
CHLORIDE: 113 mmol/L — AB (ref 101–111)
CO2: 24 mmol/L (ref 22–32)
Calcium: 8.2 mg/dL — ABNORMAL LOW (ref 8.9–10.3)
Creatinine, Ser: 1.72 mg/dL — ABNORMAL HIGH (ref 0.61–1.24)
GFR calc non Af Amer: 39 mL/min — ABNORMAL LOW (ref >60)
GFR, EST AFRICAN AMERICAN: 45 mL/min — AB (ref >60)
Glucose, Bld: 177 mg/dL — ABNORMAL HIGH (ref 65–99)
Potassium: 3.1 mmol/L — ABNORMAL LOW (ref 3.5–5.1)
Sodium: 146 mmol/L — ABNORMAL HIGH (ref 135–145)

## 2016-11-21 LAB — POCT I-STAT 3, ART BLOOD GAS (G3+)
Acid-Base Excess: 1 mmol/L (ref 0.0–2.0)
BICARBONATE: 24.1 mmol/L (ref 20.0–28.0)
O2 Saturation: 99 %
TCO2: 25 mmol/L (ref 0–100)
pCO2 arterial: 31.7 mmHg — ABNORMAL LOW (ref 32.0–48.0)
pH, Arterial: 7.49 — ABNORMAL HIGH (ref 7.350–7.450)
pO2, Arterial: 124 mmHg — ABNORMAL HIGH (ref 83.0–108.0)

## 2016-11-21 LAB — BLOOD GAS, ARTERIAL
ACID-BASE DEFICIT: 0.1 mmol/L (ref 0.0–2.0)
Bicarbonate: 21.4 mmol/L (ref 20.0–28.0)
DRAWN BY: 232811
FIO2: 0.4
MECHVT: 640 mL
O2 SAT: 99 %
PEEP: 5 cmH2O
PH ART: 7.623 — AB (ref 7.350–7.450)
Patient temperature: 98.6
RATE: 32 resp/min
pCO2 arterial: 20.4 mmHg — ABNORMAL LOW (ref 32.0–48.0)
pO2, Arterial: 152 mmHg — ABNORMAL HIGH (ref 83.0–108.0)

## 2016-11-21 LAB — MAGNESIUM: MAGNESIUM: 2.2 mg/dL (ref 1.7–2.4)

## 2016-11-21 LAB — GLUCOSE, CAPILLARY
GLUCOSE-CAPILLARY: 148 mg/dL — AB (ref 65–99)
GLUCOSE-CAPILLARY: 154 mg/dL — AB (ref 65–99)
Glucose-Capillary: 146 mg/dL — ABNORMAL HIGH (ref 65–99)

## 2016-11-21 LAB — POTASSIUM: Potassium: 3.6 mmol/L (ref 3.5–5.1)

## 2016-11-21 LAB — TRIGLYCERIDES: TRIGLYCERIDES: 248 mg/dL — AB (ref <150)

## 2016-11-21 LAB — CBC
HCT: 35.4 % — ABNORMAL LOW (ref 39.0–52.0)
HEMOGLOBIN: 11.9 g/dL — AB (ref 13.0–17.0)
MCH: 29.5 pg (ref 26.0–34.0)
MCHC: 33.6 g/dL (ref 30.0–36.0)
MCV: 87.6 fL (ref 78.0–100.0)
Platelets: 177 10*3/uL (ref 150–400)
RBC: 4.04 MIL/uL — AB (ref 4.22–5.81)
RDW: 13.6 % (ref 11.5–15.5)
WBC: 12.7 10*3/uL — AB (ref 4.0–10.5)

## 2016-11-21 LAB — PHOSPHORUS: PHOSPHORUS: 2.7 mg/dL (ref 2.5–4.6)

## 2016-11-21 LAB — HEPARIN LEVEL (UNFRACTIONATED): Heparin Unfractionated: 0.52 IU/mL (ref 0.30–0.70)

## 2016-11-21 MED ORDER — FAMOTIDINE IN NACL 20-0.9 MG/50ML-% IV SOLN
20.0000 mg | Freq: Two times a day (BID) | INTRAVENOUS | Status: DC
  Administered 2016-11-21: 20 mg via INTRAVENOUS
  Filled 2016-11-21 (×2): qty 50

## 2016-11-21 MED ORDER — POTASSIUM CHLORIDE 20 MEQ/15ML (10%) PO SOLN
40.0000 meq | Freq: Once | ORAL | Status: AC
  Administered 2016-11-21: 40 meq
  Filled 2016-11-21: qty 30

## 2016-11-21 NOTE — Progress Notes (Signed)
PULMONARY / CRITICAL CARE MEDICINE   Name: Jeffery Conrad MRN: [id number] DOB: [birth year]    ADMISSION DATE:  11/12/2016 CONSULTATION DATE:  10/28/2016 Primary cardiologist Dr. Glori Bickers  REFERRING MD:  Noemi Chapel EDP  CHIEF COMPLAINT:  Cardiac arrest and coma  BRIEF SUMMARY:  66 year old male known to have polysubstance abuse, chronic systolic heart failure with ejection fraction 30% as of 2016 and also active hepatitis C but treatment was deferred because of ongoing polysubstance abuse. Last contact with healthcare providers within the health system was one year ago in March 2017. Cocaine user, VF arrest unknown downtime, cooled. Rewarmed 2/23.    SUBJECTIVE:  Very agitated, non-purposeful, thrashing.  VITAL SIGNS: BP 116/75   Pulse 77   Temp 99.1 F (37.3 C)   Resp (!) 26   Ht _0  (1.854 m)   Wt 90.3 kg (199 lb)   SpO2 99%   BMI 26.25 kg/m   HEMODYNAMICS: CVP:  [10 mmHg-12 mmHg] 12 mmHg  VENTILATOR SETTINGS: Vent Mode: PRVC FiO2 (%):  [40 %] 40 % Set Rate:  [18 bmp-32 bmp] 18 bmp Vt Set:  [500 mL-640 mL] 500 mL PEEP:  [5 cmH20] 5 cmH20 Plateau Pressure:  [20 cmH20-23 cmH20] 22 cmH20  INTAKE / OUTPUT: I/O last 3 completed shifts: In: 3504.5 [I.V.:2300; NG/GT:1104.6; IV Piggyback:100] Out: 2165 [Urine:2165]  PHYSICAL EXAMINATION: General: Critically ill appearing male, agitated, not purposeful HEENT: MM pink/moist, ETT, pupil sluggish, no dolls eyes Neuro:  Very agitated, gagging but no response to command CV: RRR, Nl S1/S2, -M/R/G. PULM: Coarse BS diffusely GI: soft, non-tender, bsx4 active  Extremities: 1+ edema and -tenderness Skin: no rashes or lesions  LABS:  PULMONARY  Recent Labs Lab 11/14/16 0916 11/15/16 0310 11/19/16 0342 11/21/16 0310 11/21/16 0502  PHART 7.434 7.343* 7.366 7.623* 7.490*  PCO2ART 28.5* 35.2 42.9 20.4* 31.7*  PO2ART 98.0 118* 104 152* 124.0*  HCO3 19.9* 19.3* 23.9 21.4 24.1  TCO2 21  --   --   --  25   O2SAT 99.0 98.1 97.2 99.0 99.0    CBC  Recent Labs Lab 11/19/16 0400 11/20/16 0330 11/21/16 0330  HGB 13.2 12.5* 11.9*  HCT 39.9 37.8* 35.4*  WBC 9.3 7.2 12.7*  PLT 129* 134* 177   COAGULATION No results for input(s): INR in the last 168 hours. CARDIAC No results for input(s): TROPONINI in the last 168 hours. No results for input(s): PROBNP in the last 168 hours.  CHEMISTRY  Recent Labs Lab 11/15/16 1649 11/16/16 0505 11/16/16 1700 11/17/16 0410 11/18/16 0450 11/19/16 0400 11/21/16 0330  NA  --  138  --  141 141 145 146*  K  --  4.9  --  5.2* 4.3 3.8 3.1*  CL  --  110  --  108 110 112* 113*  CO2  --  18*  --  21* _1 GLUCOSE  --  161*  --  79 138* 120* 177*  BUN  --  21*  --  32* 43* 45* 52*  CREATININE  --  2.50*  --  2.16* 2.02* 1.86* 1.72*  CALCIUM  --  8.1*  --  8.7* 8.8* 8.6* 8.2*  MG 2.0 2.1 2.3  --   --  2.6* 2.2  PHOS 4.5 4.8* 5.6*  --   --  2.7 2.7   Estimated Creatinine Clearance: 46.5 mL/min (by C-G formula based on SCr of 1.72 mg/dL (H)).  LIVER  Recent Labs Lab 11/15/16 1142 11/16/16 0505 11/17/16 0410  AST 67* 65* 55*  ALT 66* 55 47  ALKPHOS 53 43 45  BILITOT 2.4* 1.1 1.6*  PROT 6.4* 6.3* 7.3  ALBUMIN 2.5* 2.5* 2.7*   INFECTIOUS  Recent Labs Lab 11/15/16 0406  PROCALCITON 2.16   ENDOCRINE CBG (last 3)   Recent Labs  11/20/16 1952 11/20/16 2305 11/21/16 0324  GLUCAP 143* 157* 148*   IMAGING x48h  - image(s) personally visualized  -   highlighted in bold Dg Chest Port 1 View  Result Date: 11/21/2016 CLINICAL DATA:  Intubation. EXAM: PORTABLE CHEST 1 VIEW COMPARISON:  . FINDINGS: Endotracheal tube NG tube plating she. Cardiomegaly. No pulmonary venous congestion . Left base subsegmental atelectasis. No pleural effusion or pneumothorax. IMPRESSION: 1. Lines and tubes stable position. 2.  Cardiomegaly.  No pulmonary venous congestion . 3. Mild left base subsegmental atelectasis Electronically Signed   By: Marcello Moores  Register    On: 11/21/2016 06:29   Dg Chest Port 1 View  Result Date: 11/20/2016 CLINICAL DATA:  Check endotracheal tube position EXAM: PORTABLE CHEST 1 VIEW COMPARISON:  11/19/2016 FINDINGS: Cardiomediastinal silhouette is stable. Endotracheal tube in place with tip 5.5 cm above the carina. NG tube in place unchanged in position. No infiltrate or pulmonary edema. Stable left subclavian central line position. Slight improvement in aeration. IMPRESSION: Endotracheal tube in place with tip 5.5 cm above the carina. NG tube in place unchanged in position. No infiltrate or pulmonary edema. Stable left subclavian central line position. Electronically Signed   By: Lahoma Crocker M.D.   On: 11/20/2016 08:46   Dg Chest Port 1 View  Result Date: 11/19/2016 CLINICAL DATA:  66 year old male who presented with cardiac arrest, found unresponsive. Intubated. Initial encounter. EXAM: PORTABLE CHEST 1 VIEW COMPARISON:  0440 hours today and earlier. FINDINGS: Portable AP semi upright view at 0945 hours. Endotracheal tube tip is in good position between the level the clavicles and carina. Enteric tube courses to the left upper quadrant, tip not included. Stable left subclavian central line. Stable lung volumes. Stable cardiac size and mediastinal contours. Patchy interstitial opacity in the right mid and lower lung is stable to mildly improved since earlier today. No overt pulmonary edema. No pneumothorax or pleural effusion identified on this image. IMPRESSION: 1. Endotracheal tube in good position. Enteric tube courses to the left upper quadrant, tip not included. 2. Improved bilateral ventilation since 11/18/2016. Residual patchy interstitial opacity in the right mid and lower lung with mild further improvement since this morning. Electronically Signed   By: Genevie Ann M.D.   On: 11/19/2016 11:00   Dg Abd Portable 1v  Result Date: 11/19/2016 CLINICAL DATA:  Orogastric tube placement. EXAM: PORTABLE ABDOMEN - 1 VIEW COMPARISON:  Abdominal  radiograph November 19, 2016 at 11:30 a.m. FINDINGS: Nasogastric tube appears looped in proximal stomach, side port past the GE junction. Included bowel gas pattern is nondilated and nonobstructive. No intra- abdominal mass effect or pathologic calcifications. Soft tissue planes and included osseous structures are unchanged. IMPRESSION: Nasogastric tube projects in proximal stomach. Electronically Signed   By: Elon Alas M.D.   On: 11/19/2016 22:30   Dg Abd Portable 1v  Result Date: 11/19/2016 CLINICAL DATA:  Endotracheal and orogastric tube placements EXAM: PORTABLE ABDOMEN - 1 VIEW COMPARISON:  Portable exam 0945 hours compared 11/16/2016 FINDINGS: Tip of gastric tube projects over stomach with proximal side-port at GE junction region. Bowel gas pattern normal. Lung bases clear. Bones demineralized. IMPRESSION: Tip of gastric tube projects over proximal stomach with proximal side-port  at the expected position of the gastroesophageal junction. Electronically Signed   By: Lavonia Dana M.D.   On: 11/19/2016 11:32   SIGNIFICANT EVENTS: 2/21  Admit after VF cardiac arrest  2/23  Re-warmed  2/25  Not waking on neuro exam  STUDIES:  ECHO 2/22 >> LVEF 30-35%, diffuse hypokinesis, grade II diastolic dysfunction, PA peak 22  CULTURES: Viral PCR 2/21 >> negative UC 2/21 >> negative  Sputum 2/22 >> Polymicrobial BCx2 2/21 >> NTD  ANTIBIOTICS: Vanco 2/21 >> 2/24 Zosyn 2/21 >>2/28  ASSESSMENT / PLAN:  PULMONARY A: Acute Respiratory Failure in setting of Cardiac Arrest  Difficult Airway - in ER during airway exchange 2/21  Suspected Aspiration PNA Polysubstance Abuse / Smoker P:   PRVC, hold weaning given mental status Wean PEEP/FiO2 for sats > 88% CXR and ABG in AM Adjust vent to ABG CXR now to verify ETT position. BD's  CARDIOVASCULAR A:  VF Cardiac Arrest - suspect secondary to cocaine abuse vs MI Bradycardia - in setting of hypothermia protocol  Chronic Systolic CHF with EF  35% in 2016 Adrenal Insufficiency  P:  Cardiology following  Continue stress dose steroids DNR, no vasopressors Heparin full dose for a-fib  RENAL A:   ATN, ARF, post arrest P:   KVO IVF Trend BMP / UOP  Replace electrolytes as indicated Avoid nephrotoxic agents as able BMET in AM  GASTROINTESTINAL A:   Nausea / Vomiting - 2/25 History of hepatitis C - treatment deferred because of polysubstance abuse P:   TF per nutrition PRN zofran  HEMATOLOGIC A:   Thrombocytopenia  P:  Trend CBC  Monitor for bleeding  SQ heparin for DVT prophylaxis   INFECTIOUS A: R/O HCAP P: Cultures as above  ABX as above, D8/8   ENDOCRINE A:   At risk for hyperglycemia Rel AI P:   Wean stress steroids 2/25, change frequency to Q8 SSI   NEUROLOGIC A:   Polysubstance abuse S/p arrest, anoxia - EEG negative P:   Hypothermia protocol completed Would ideally like to obtain MRI but family reports he has hardware in his forearms, verified by X-ray. EEG per neuro Defer CT to neurology Neuro consult for prognostication - poor, neurology favors continued observation. D/C versed D/C fentanyl drip Propofol drip and PRN fentanyl  FAMILY  - Updates: Met with family on 2/28, they are clear that unless patient is to go back to full function like he was prior to arrest he would not want to be alive.  It is clear to me that patient will not go back to full function and family decided to transition to full DNR and if no improvement by AM then will proceed to full comfort care.  DNR but continue full support for now.  The patient is critically ill with multiple organ systems failure and requires high complexity decision making for assessment and support, frequent evaluation and titration of therapies, application of advanced monitoring technologies and extensive interpretation of multiple databases.   Critical Care Time devoted to patient care services described in this note is  32  Minutes.  This time reflects time of care of this signee Dr Jennet Maduro. This critical care time does not reflect procedure time, or teaching time or supervisory time of PA/NP/Med student/Med Resident etc but could involve care discussion time.  Rush Farmer, M.D. [facility] Pulmonary/Critical Care Medicine. Pager: [phone]. After hours pager: [phone].  11/21/2016, 8:46 AM

## 2016-11-21 NOTE — Progress Notes (Signed)
eLink Physician-Brief Progress Note Patient Name: Jeffery Conrad DOB: 04-Dec-1950 MRN: [id number]   Date of Service  11/21/2016  HPI/Events of Note  No stress ulcer prophylaxis,  eICU Interventions   ordered pepcid     Intervention Category Intermediate Interventions: Best-practice therapies (e.g. DVT, beta blocker, etc.)  Simonne Maffucci 11/21/2016, 3:36 PM

## 2016-11-21 NOTE — Progress Notes (Signed)
Subjective: No significant changes  Exam: Vitals:   11/21/16 1600 11/21/16 1700  BP: 120/78 138/79  Pulse: 73 72  Resp: (!) 23 (!) 24  Temp: 99 F (37.2 C) 99.1 F (37.3 C)   Gen: In bed, Intubated Resp: Ventilated Abd: soft, nt  Neuro: MS: His eyes are Closed, he grimaces to noxious stimulation but does not follow commands. He does not track  CN: He does not blink to threat, his pupils are equal, no disconjugate gaze Motor: He withdrawals to noxious stimulation in all 4 ext Sensory: As above  Impression: 66 year old male status post anoxic arrest with persistent encephalopathy. He does appear to have made some progress. There are no definite signs of poor prognosis at this time, though I suspect that he likley has had a significant degree of injury.   Family has stated definitely no to trach/peg and made him DNR.  Recommendations: 1) continue to reassess daily.  Roland Rack, MD [facility] [phone]  If 7pm- 7am, please page neurology on call as listed in [place].

## 2016-11-21 NOTE — Progress Notes (Signed)
ANTICOAGULATION CONSULT NOTE - Follow up Pharmacy Consult for Heparin Indication: atrial fibrillation  No Known Allergies  Patient Measurements: Height: 6\' 1"  (185.4 cm) Weight: 199 lb (90.3 kg) IBW/kg (Calculated) : 79.9   Vital Signs: Temp: 99 F (37.2 C) (03/01 0815) Temp Source: Core (Comment) (03/01 0400) BP: 91/69 (03/01 0815) Pulse Rate: 75 (03/01 0815)  Labs:  Recent Labs  11/19/16 0400  11/19/16 2350 11/20/16 0330 11/21/16 0330  HGB 13.2  --   --  12.5* 11.9*  HCT 39.9  --   --  37.8* 35.4*  PLT 129*  --   --  134* 177  HEPARINUNFRC  --   < > 0.52 0.50 0.52  CREATININE 1.86*  --   --   --  1.72*  < > = values in this interval not displayed.  Estimated Creatinine Clearance: 46.5 mL/min (by C-G formula based on SCr of 1.72 mg/dL (H)).   Medical History: Past Medical History:  Diagnosis Date  . Accelerated hypertension   . Chronic systolic CHF (congestive heart failure) ([place])    a. 07/2014 Echo: EF 25-30%, diff HK, Gr 1 DD.  Marland Kitchen CKD (chronic kidney disease), stage II   . COPD (chronic obstructive pulmonary disease) ([place])   . Crack cocaine use   . Fracture 1976   Bilateral forearm compound fractures  . Hepatitis C    a. 07/2014 HCV AB reactive, HCV quant log: 5.7, HCV quant [id number].  Marland Kitchen NICM (nonischemic cardiomyopathy) ([place])    a. 07/2014 Echo: EF 25-30%, diff HK, Gr 1 DD.  . Non-obstructive CAD    a. 07/2014 Cath: LM nl, LAD nonobs, LCX nl, OM nl, RCA 50d, EF 20-25%  . Noncompliance   . Tobacco use disorder       Assessment: 66yom s/p cardiac arrest, s/p hypothermia protocol, remains intubated and sedated.  New Afib cvr on amiodarone, thus pharmacy consulted to start IV anticoagulation with heparin drip. CBC slight drop - watch.   Heparin drip 1100 uts/hr HL 0.5 at goal   Goal of Therapy:  Heparin level 0.3-0.7 units/ml Monitor platelets by anticoagulation protocol: Yes   Plan:  Continue IV Heparin drip 1100 uts/hr  Daily HL, CBC  Bonnita Nasuti Pharm.D. CPP, BCPS Clinical Pharmacist [phone] 11/21/2016 9:42 AM

## 2016-11-21 NOTE — Progress Notes (Signed)
eLink Physician-Brief Progress Note Patient Name: Jeffery Conrad DOB: 28-Apr-1951 MRN: [id number]   Date of Service  11/21/2016  HPI/Events of Note  Low K  eICU Interventions  K repleted     Intervention Category Major Interventions: Other:  [place] 11/21/2016, 4:21 AM

## 2016-11-21 NOTE — Progress Notes (Signed)
eLink Physician-Brief Progress Note Patient Name: Jeffery Conrad DOB: 05-Dec-1950 MRN: [id number]   Date of Service  11/21/2016  HPI/Events of Note  ABG with severe resp alkalosis 7.6/20  eICU Interventions  Vent adjusted ( MV decreased from 20 to 9) Rpt abg in 1-2 hrs.      Intervention Category Major Interventions: Other:  [place] 11/21/2016, 3:39 AM

## 2016-11-21 DEATH — deceased

## 2016-11-22 ENCOUNTER — Inpatient Hospital Stay (HOSPITAL_COMMUNITY): Payer: Medicare Other

## 2016-11-22 LAB — BASIC METABOLIC PANEL
Anion gap: 8 (ref 5–15)
BUN: 70 mg/dL — AB (ref 6–20)
CALCIUM: 7.8 mg/dL — AB (ref 8.9–10.3)
CO2: 25 mmol/L (ref 22–32)
CREATININE: 2.27 mg/dL — AB (ref 0.61–1.24)
Chloride: 112 mmol/L — ABNORMAL HIGH (ref 101–111)
GFR calc Af Amer: 32 mL/min — ABNORMAL LOW (ref >60)
GFR, EST NON AFRICAN AMERICAN: 28 mL/min — AB (ref >60)
GLUCOSE: 137 mg/dL — AB (ref 65–99)
Potassium: 3.4 mmol/L — ABNORMAL LOW (ref 3.5–5.1)
SODIUM: 145 mmol/L (ref 135–145)

## 2016-11-22 LAB — BLOOD GAS, ARTERIAL
Acid-Base Excess: 1.8 mmol/L (ref 0.0–2.0)
Bicarbonate: 25 mmol/L (ref 20.0–28.0)
DRAWN BY: 31101
FIO2: 40
MECHVT: 500 mL
O2 Saturation: 98.9 %
PATIENT TEMPERATURE: 98.6
PCO2 ART: 33.6 mmHg (ref 32.0–48.0)
PEEP: 5 cmH2O
PO2 ART: 163 mmHg — AB (ref 83.0–108.0)
RATE: 18 resp/min
pH, Arterial: 7.485 — ABNORMAL HIGH (ref 7.350–7.450)

## 2016-11-22 LAB — HEPARIN LEVEL (UNFRACTIONATED): Heparin Unfractionated: 0.54 IU/mL (ref 0.30–0.70)

## 2016-11-22 LAB — GLUCOSE, CAPILLARY
GLUCOSE-CAPILLARY: 140 mg/dL — AB (ref 65–99)
GLUCOSE-CAPILLARY: 144 mg/dL — AB (ref 65–99)
Glucose-Capillary: 124 mg/dL — ABNORMAL HIGH (ref 65–99)

## 2016-11-22 LAB — CBC
HCT: 28.7 % — ABNORMAL LOW (ref 39.0–52.0)
Hemoglobin: 9.6 g/dL — ABNORMAL LOW (ref 13.0–17.0)
MCH: 29.8 pg (ref 26.0–34.0)
MCHC: 33.4 g/dL (ref 30.0–36.0)
MCV: 89.1 fL (ref 78.0–100.0)
PLATELETS: 222 10*3/uL (ref 150–400)
RBC: 3.22 MIL/uL — AB (ref 4.22–5.81)
RDW: 14.3 % (ref 11.5–15.5)
WBC: 18.8 10*3/uL — ABNORMAL HIGH (ref 4.0–10.5)

## 2016-11-22 LAB — PHOSPHORUS: Phosphorus: 4.7 mg/dL — ABNORMAL HIGH (ref 2.5–4.6)

## 2016-11-22 LAB — MAGNESIUM: MAGNESIUM: 2.3 mg/dL (ref 1.7–2.4)

## 2016-11-22 MED ORDER — FENTANYL 2500MCG IN NS 250ML (10MCG/ML) PREMIX INFUSION: 100.0000 ug/h | INTRAVENOUS | Status: DC

## 2016-11-22 MED ORDER — MORPHINE BOLUS VIA INFUSION
5.0000 mg | INTRAVENOUS | Status: DC | PRN
  Administered 2016-11-22: 10 mg via INTRAVENOUS
  Filled 2016-11-22: qty 20

## 2016-11-22 MED ORDER — SODIUM CHLORIDE 0.9 % IV SOLN
10.0000 mg/h | INTRAVENOUS | Status: DC
  Administered 2016-11-22: 35 mg/h via INTRAVENOUS
  Administered 2016-11-22: 5 mg/h via INTRAVENOUS
  Administered 2016-11-23: 35 mg/h via INTRAVENOUS
  Filled 2016-11-22 (×3): qty 10

## 2016-11-22 NOTE — Progress Notes (Signed)
Pt extubated per MD order. Pt comfortable on morphine gtt and bolus. No distress or discomfort noted after extubation.

## 2016-11-22 NOTE — Progress Notes (Signed)
PULMONARY / CRITICAL CARE MEDICINE   Name: Jeffery Conrad MRN: [id number] DOB: [birth year]    ADMISSION DATE:  11/03/2016 CONSULTATION DATE:  11/15/2016 Primary cardiologist Dr. Glori Bickers  REFERRING MD:  Noemi Chapel EDP  CHIEF COMPLAINT:  Cardiac arrest and coma  BRIEF SUMMARY:  66 year old male known to have polysubstance abuse, chronic systolic heart failure with ejection fraction 30% as of 2016 and also active hepatitis C but treatment was deferred because of ongoing polysubstance abuse. Last contact with healthcare providers within the health system was one year ago in March 2017. Cocaine user, VF arrest unknown downtime, cooled. Rewarmed 2/23.    SUBJECTIVE:  No events overnight, sedate on propofol  VITAL SIGNS: BP (!) 155/75   Pulse 77   Temp 98.8 F (37.1 C)   Resp (!) 23   Ht 6\' 1"  (1.854 m)   Wt 91.5 kg (201 lb 11.5 oz)   SpO2 99%   BMI 26.61 kg/m   HEMODYNAMICS: CVP:  [10 mmHg-11 mmHg] 10 mmHg  VENTILATOR SETTINGS: Vent Mode: PRVC FiO2 (%):  [40 %] 40 % Set Rate:  [18 bmp] 18 bmp Vt Set:  [500 mL] 500 mL PEEP:  [5 cmH20] 5 cmH20 Plateau Pressure:  [14 cmH20-18 cmH20] 15 cmH20  INTAKE / OUTPUT: I/O last 3 completed shifts: In: 1984.9 [I.V.:1934.9; IV Piggyback:50] Out: [id number] [Urine:1065; Emesis/NG output:125]  PHYSICAL EXAMINATION: General: Critically ill appearing male, sedate on propofol but breathing over the vent HEENT: Thermal/AT, PERRL, EOM-I and MMM Neuro:  Sedate on propofol, moving ext to pain. CV: RRR, Nl S1/S2, -M/R/G. PULM: Coarse bilaterally GI: Soft, NT, ND and +BS Extremities: 1+ edema and -tenderness Skin: no rashes or lesions  LABS:  PULMONARY  Recent Labs Lab 11/19/16 0342 11/21/16 0310 11/21/16 0502 11/22/16 0238  PHART 7.366 7.623* 7.490* 7.485*  PCO2ART 42.9 20.4* 31.7* 33.6  PO2ART 104 152* 124.0* 163*  HCO3 23.9 21.4 24.1 25.0  TCO2  --   --  25  --   O2SAT 97.2 99.0 99.0 98.9   CBC  Recent Labs Lab  11/20/16 0330 11/21/16 0330 11/22/16 0400  HGB 12.5* 11.9* 9.6*  HCT 37.8* 35.4* 28.7*  WBC 7.2 12.7* 18.8*  PLT 134* 177 222   COAGULATION No results for input(s): INR in the last 168 hours.  CARDIAC No results for input(s): TROPONINI in the last 168 hours. No results for input(s): PROBNP in the last 168 hours.  CHEMISTRY  Recent Labs Lab 11/16/16 0505 11/16/16 1700 11/17/16 0410 11/18/16 0450 11/19/16 0400 11/21/16 0330 11/21/16 0800 11/22/16 0400  NA 138  --  141 141 145 146*  --  145  K 4.9  --  5.2* 4.3 3.8 3.1* 3.6 3.4*  CL 110  --  108 110 112* 113*  --  112*  CO2 18*  --  21* 23 25 24   --  25  GLUCOSE 161*  --  79 138* 120* 177*  --  137*  BUN 21*  --  32* 43* 45* 52*  --  70*  CREATININE 2.50*  --  2.16* 2.02* 1.86* 1.72*  --  2.27*  CALCIUM 8.1*  --  8.7* 8.8* 8.6* 8.2*  --  7.8*  MG 2.1 2.3  --   --  2.6* 2.2  --  2.3  PHOS 4.8* 5.6*  --   --  2.7 2.7  --  4.7*   Estimated Creatinine Clearance: 35.2 mL/min (by C-G formula based on SCr of 2.27 mg/dL (H)).  LIVER  Recent Labs Lab 11/15/16 1142 11/16/16 0505 11/17/16 0410  AST 67* 65* 55*  ALT 66* 55 47  ALKPHOS 53 43 45  BILITOT 2.4* 1.1 1.6*  PROT 6.4* 6.3* 7.3  ALBUMIN 2.5* 2.5* 2.7*   INFECTIOUS No results for input(s): LATICACIDVEN, PROCALCITON in the last 168 hours. ENDOCRINE CBG (last 3)   Recent Labs  11/22/16 0043 11/22/16 0412 11/22/16 0818  GLUCAP 140* 124* 144*   IMAGING x48h  - image(s) personally visualized  -   highlighted in bold Dg Chest Port 1 View  Result Date: 11/22/2016 CLINICAL DATA:  Respiratory failure with ventilator dependence. EXAM: PORTABLE CHEST 1 VIEW COMPARISON:  Multiple recent previous exams. FINDINGS: 0558 hours. Endotracheal tube tip 5.2 cm above the base the carina. Left subclavian central line tip is positioned at the innominate vein confluence. The NG tube passes into the stomach although the distal tip position is not included on the film.  Cardiopericardial silhouette is at upper limits of normal for size. No evidence for pulmonary edema or dense focal airspace consolidation. Hazy opacity overlying the lower lungs may reflect atelectasis or some small layering pleural effusions. The visualized bony structures of the thorax are intact. Telemetry leads overlie the chest. IMPRESSION: Slight interval increase in hazy opacity in the lung bases, likely atelectatic although layering pleural fluid could produce this appearance. Electronically Signed   By: Misty Stanley M.D.   On: 11/22/2016 07:33   Dg Chest Port 1 View  Result Date: 11/21/2016 CLINICAL DATA:  Intubation. EXAM: PORTABLE CHEST 1 VIEW COMPARISON:  . FINDINGS: Endotracheal tube NG tube plating she. Cardiomegaly. No pulmonary venous congestion . Left base subsegmental atelectasis. No pleural effusion or pneumothorax. IMPRESSION: 1. Lines and tubes stable position. 2.  Cardiomegaly.  No pulmonary venous congestion . 3. Mild left base subsegmental atelectasis Electronically Signed   By: [place]   On: 11/21/2016 06:29   SIGNIFICANT EVENTS: 2/21  Admit after VF cardiac arrest  2/23  Re-warmed  2/25  Not waking on neuro exam  STUDIES:  ECHO 2/22 >> LVEF 30-35%, diffuse hypokinesis, grade II diastolic dysfunction, PA peak 22  CULTURES: Viral PCR 2/21 >> negative UC 2/21 >> negative  Sputum 2/22 >> Polymicrobial BCx2 2/21 >> NTD  ANTIBIOTICS: Vanco 2/21 >> 2/24 Zosyn 2/21 >>2/28  ASSESSMENT / PLAN:  PULMONARY A: Acute Respiratory Failure in setting of Cardiac Arrest  Difficult Airway - in ER during airway exchange 2/21  Suspected Aspiration PNA Polysubstance Abuse / Smoker P:   One way extubation this AM to comfort Titrate O2 to off Morphine for comfort  CARDIOVASCULAR A:  VF Cardiac Arrest - suspect secondary to cocaine abuse vs MI Bradycardia - in setting of hypothermia protocol  Chronic Systolic CHF with EF [id number] in 2016 Adrenal Insufficiency  P:  D/C  tele  RENAL A:   ATN, ARF, post arrest P:   D/C blood draws KVO IVF  GASTROINTESTINAL A:   Nausea / Vomiting - 2/25 History of hepatitis C - treatment deferred because of polysubstance abuse P:   D/C TF  HEMATOLOGIC A:   Thrombocytopenia  P:  D/C blood draws  INFECTIOUS A: R/O HCAP P: D/C abx  ENDOCRINE A:   At risk for hyperglycemia Rel AI P:   D/C CBGs  NEUROLOGIC A:   Polysubstance abuse S/p arrest, anoxia - EEG negative P:   Propofol for now Morphine for comfort  FAMILY  - Updates: Spoke with fiance, proceed with comfort care today when family arrives,  one way extubation, morphine and propofol.  Full DNR.  The patient is critically ill with multiple organ systems failure and requires high complexity decision making for assessment and support, frequent evaluation and titration of therapies, application of advanced monitoring technologies and extensive interpretation of multiple databases.   Critical Care Time devoted to patient care services described in this note is  32  Minutes. This time reflects time of care of this signee Dr Jennet Maduro. This critical care time does not reflect procedure time, or teaching time or supervisory time of PA/NP/Med student/Med Resident etc but could involve care discussion time.  Rush Farmer, M.D. [facility] Pulmonary/Critical Care Medicine. Pager: [phone]. After hours pager: [phone].  11/22/2016, 10:30 AM

## 2016-11-22 NOTE — Progress Notes (Signed)
ANTICOAGULATION CONSULT NOTE - Follow up Pharmacy Consult for Heparin Indication: atrial fibrillation  No Known Allergies  Patient Measurements: Height: 6\' 1"  (185.4 cm) Weight: 201 lb 11.5 oz (91.5 kg) IBW/kg (Calculated) : 79.9   Vital Signs: Temp: 98.8 F (37.1 C) (03/02 0700) Temp Source: Core (Comment) (03/02 0400) BP: 145/84 (03/02 0700) Pulse Rate: 74 (03/02 0700)  Labs:  Recent Labs  11/20/16 0330 11/21/16 0330 11/22/16 0400  HGB 12.5* 11.9* 9.6*  HCT 37.8* 35.4* 28.7*  PLT 134* 177 222  HEPARINUNFRC 0.50 0.52 0.54  CREATININE  --  1.72* 2.27*    Estimated Creatinine Clearance: 35.2 mL/min (by C-G formula based on SCr of 2.27 mg/dL (H)).   Medical History: Past Medical History:  Diagnosis Date  . Accelerated hypertension   . Chronic systolic CHF (congestive heart failure) ([place])    a. 07/2014 Echo: EF 25-30%, diff HK, Gr 1 DD.  Marland Kitchen CKD (chronic kidney disease), stage II   . COPD (chronic obstructive pulmonary disease) ([place])   . Crack cocaine use   . Fracture 1976   Bilateral forearm compound fractures  . Hepatitis C    a. 07/2014 HCV AB reactive, HCV quant log: 5.7, HCV quant [id number].  Marland Kitchen NICM (nonischemic cardiomyopathy) ([place])    a. 07/2014 Echo: EF 25-30%, diff HK, Gr 1 DD.  . Non-obstructive CAD    a. 07/2014 Cath: LM nl, LAD nonobs, LCX nl, OM nl, RCA 50d, EF 20-25%  . Noncompliance   . Tobacco use disorder    . sodium chloride Stopped (11/21/16 0000)  . amiodarone 30 mg/hr (11/22/16 0535)  . feeding supplement (VITAL AF 1.2 CAL) Stopped (11/20/16 1500)  . fentaNYL infusion INTRAVENOUS Stopped (11/19/16 0536)  . heparin 1,100 Units/hr (11/22/16 0535)  . propofol (DIPRIVAN) infusion 30 mcg/kg/min (11/22/16 0716)    Assessment: 71 yom s/p cardiac arrest, s/p hypothermia protocol, remains intubated and sedated.  New Afib cvr on amiodarone, thus pharmacy consulted to start IV anticoagulation with heparin drip.   Heparin level therapeutic on  heparin gtt at 1100 units/hr. Hgb continues to trend down, no overt bleeding noted per discussion with RN.    Goal of Therapy:  Heparin level 0.3-0.7 units/ml Monitor platelets by anticoagulation protocol: Yes   Plan:  Continue IV Heparin drip 1100 units/hr Daily heparin level and CBC. Noted plans for likely withdrawal of care today.  Uvaldo Rising, BCPS  Clinical Pharmacist Pager [phone]  11/22/2016 8:28 AM

## 2016-11-22 NOTE — Progress Notes (Signed)
Nutrition Brief Note  Chart reviewed. Pt now transitioning to comfort care.  No further nutrition interventions warranted at this time.  Please re-consult as needed.   Stclair Szymborski RD, LDN, CNSC [phone] Pager [phone] After Hours Pager    

## 2016-11-22 NOTE — Progress Notes (Signed)
Responded to consult for palliative. Provided spiritual/emotional/grief support and prayer to several family members in the rm. Although the nurse had said their own pastor had just left, they really appreciated the prayer. Chaplain available for f/u.    11/22/16 1600  Clinical Encounter Type  Visited With Patient and family together  Visit Type Initial;Psychological support;Spiritual support;Social support;Critical Care  Referral From Nurse  Spiritual Encounters  Spiritual Needs Prayer;Emotional;Grief support  Stress Factors  Family Stress Factors Loss   Gerrit Heck, Chaplain

## 2016-11-27 ENCOUNTER — Telehealth: Payer: Self-pay

## 2016-11-27 NOTE — Telephone Encounter (Signed)
On 11/27/16 I received a death certificate from [place]. (original). The death certificate is for cremation. The patient is a patient of Doctor Elsworth Soho. The death certificate will be taken to [facility] on Friday [phone]) for signature.  On 2016/12/21 I received the death certificate back from Doctor Elsworth Soho. I got the death certificate ready and called the funeral home to let them know the death certificate is ready for pickup. I also faxed a copy to the funeral home per their request.

## 2016-12-22 NOTE — Progress Notes (Signed)
Agree with previous note by Winfred Burn. Jeffery Conrad

## 2016-12-22 NOTE — Discharge Summary (Signed)
NAME:  Jeffery Conrad, Jeffery Conrad NO.:  [id number]  MEDICAL RECORD NO.:  [id number]  LOCATION:  [id number]                        FACILITY:  [facility]  PHYSICIAN:  Providence Lanius, MD  DATE OF BIRTH:  June 27, 1951  DATE OF ADMISSION:  10/26/2016 DATE OF DISCHARGE:                              DISCHARGE SUMMARY   DEATH SUMMARY  PRIMARY DIAGNOSIS/CAUSE OF DEATH:  Ventricular fibrillation cardiac arrest.  SECONDARY DIAGNOSES: 1. Anoxic brain injury. 2. Acute respiratory failure. 3. Polysubstance abuse. 4. Cocaine abuse. 5. Alcohol abuse. 6. Tobacco abuse. 7. Adrenal insufficiency. 8. Acute tubular necrosis. 9. Acute renal failure. 10.History of hepatitis C, treatment was deferred due to ongoing     substance abuse, thrombocytopenia, and anoxic brain injury.  HOSPITAL COURSE:  The patient is a 66 year old male with extensive past medical history including polysubstance abuse, heart failure, and hepatitis C with treatment for all that deferred due to ongoing substance abuse issues.  The patient also has been extremely noncompliant with medication and for that he was not a candidate for any active treatment.  The patient was found down by family members and EMS was called.  The patient was found to be in VFib and resuscitation was started.  The patient was brought to the Emergency Department for [facility], was asked to admit.  After ongoing support in the Intensive Care Unit, the patient failed to wake up or was making no progress at which point, I had an extensive conversation with the patient's fiancee, who has evidently been his decision maker throughout the entire hospitalization and after discussion decision was made to change the patient do not resuscitate status and proceed with full comfort care upon arrival of the family.  Once family arrived, the patient was made full comfort care and was extubated, expired shortly thereafter with the family at bedside.     Providence Lanius, MD     WJY/MEDQ  D:  12/03/2016  T:  12/03/2016  Job:  [id number]

## 2016-12-22 NOTE — Progress Notes (Signed)
PULMONARY / CRITICAL CARE MEDICINE   Name: Jeffery Conrad MRN: [id number] DOB: 06/20/1951    ADMISSION DATE:  11/03/2016 CONSULTATION DATE:  11/16/2016 Primary cardiologist Dr. Glori Bickers  REFERRING MD:  Noemi Chapel EDP  CHIEF COMPLAINT:  Cardiac arrest and coma  BRIEF SUMMARY:  66 year old male known to have polysubstance abuse, chronic systolic heart failure with ejection fraction 30% as of 2016 and also active hepatitis C but treatment was deferred because of ongoing polysubstance abuse. Last contact with healthcare providers within the health system was one year ago in March 2017. Cocaine user, VF arrest unknown downtime, cooled. Rewarmed 2/23.    SUBJECTIVE:   Unresponsive on morphine   VITAL SIGNS: BP (!) 105/57 (BP Location: Right Arm)   Pulse 92   Temp (!) 100.6 F (38.1 C)   Resp 19   Ht 6\' 1"  (1.854 m)   Wt 201 lb 11.5 oz (91.5 kg)   SpO2 (!) 78%   BMI 26.61 kg/m   HEMODYNAMICS:    VENTILATOR SETTINGS: FiO2 (%):  [40 %] 40 %  INTAKE / OUTPUT: I/O last 3 completed shifts: In: 1356.1 [I.V.:1356.1] Out: 760 [Urine:635; Emesis/NG output:125]  PHYSICAL EXAMINATION: General: unresponsive and appears to be actively dying  HEENT: NCAT Neuro: unresponsive.  CV: RRR PULM: scattered rhonchi w/ episodes of apnea and shallow resp effort GI: soft and hypoactive Extremities:cool and mottled Skin: intact  LABS:  PULMONARY  Recent Labs Lab 11/19/16 0342 11/21/16 0310 11/21/16 0502 11/22/16 0238  PHART 7.366 7.623* 7.490* 7.485*  PCO2ART 42.9 20.4* 31.7* 33.6  PO2ART 104 152* 124.0* 163*  HCO3 23.9 21.4 24.1 25.0  TCO2  --   --  25  --   O2SAT 97.2 99.0 99.0 98.9   CBC  Recent Labs Lab 11/20/16 0330 11/21/16 0330 11/22/16 0400  HGB 12.5* 11.9* 9.6*  HCT 37.8* 35.4* 28.7*  WBC 7.2 12.7* 18.8*  PLT 134* 177 222   COAGULATION No results for input(s): INR in the last 168 hours.  CARDIAC No results for input(s): TROPONINI in the last 168  hours. No results for input(s): PROBNP in the last 168 hours.  CHEMISTRY  Recent Labs Lab 11/16/16 1700  11/17/16 0410 11/18/16 0450 11/19/16 0400 11/21/16 0330 11/21/16 0800 11/22/16 0400  NA  --   --  141 141 145 146*  --  145  K  --   < > 5.2* 4.3 3.8 3.1* 3.6 3.4*  CL  --   --  108 110 112* 113*  --  112*  CO2  --   --  21* 23 25 24   --  25  GLUCOSE  --   --  79 138* 120* 177*  --  137*  BUN  --   --  32* 43* 45* 52*  --  70*  CREATININE  --   --  2.16* 2.02* 1.86* 1.72*  --  2.27*  CALCIUM  --   --  8.7* 8.8* 8.6* 8.2*  --  7.8*  MG 2.3  --   --   --  2.6* 2.2  --  2.3  PHOS 5.6*  --   --   --  2.7 2.7  --  4.7*  < > = values in this interval not displayed. Estimated Creatinine Clearance: 35.2 mL/min (by C-G formula based on SCr of 2.27 mg/dL (H)).  LIVER  Recent Labs Lab 11/17/16 0410  AST 55*  ALT 47  ALKPHOS 45  BILITOT 1.6*  PROT 7.3  ALBUMIN  2.7*   INFECTIOUS No results for input(s): LATICACIDVEN, PROCALCITON in the last 168 hours. ENDOCRINE CBG (last 3)   Recent Labs  11/22/16 0043 11/22/16 0412 11/22/16 0818  GLUCAP 140* 124* 144*   IMAGING x48h  - image(s) personally visualized  -   highlighted in bold Dg Chest Port 1 View  Result Date: 11/22/2016 CLINICAL DATA:  Respiratory failure with ventilator dependence. EXAM: PORTABLE CHEST 1 VIEW COMPARISON:  Multiple recent previous exams. FINDINGS: 0558 hours. Endotracheal tube tip 5.2 cm above the base the carina. Left subclavian central line tip is positioned at the innominate vein confluence. The NG tube passes into the stomach although the distal tip position is not included on the film. Cardiopericardial silhouette is at upper limits of normal for size. No evidence for pulmonary edema or dense focal airspace consolidation. Hazy opacity overlying the lower lungs may reflect atelectasis or some small layering pleural effusions. The visualized bony structures of the thorax are intact. Telemetry leads  overlie the chest. IMPRESSION: Slight interval increase in hazy opacity in the lung bases, likely atelectatic although layering pleural fluid could produce this appearance. Electronically Signed   By: Misty Stanley M.D.   On: 11/22/2016 07:33   SIGNIFICANT EVENTS: 2/21  Admit after VF cardiac arrest  2/23  Re-warmed  2/25  Not waking on neuro exam  STUDIES:  ECHO 2/22 >> LVEF 30-35%, diffuse hypokinesis, grade II diastolic dysfunction, PA peak 22  CULTURES: Viral PCR 2/21 >> negative UC 2/21 >> negative  Sputum 2/22 >> Polymicrobial BCx2 2/21 >> NTD  ANTIBIOTICS: Vanco 2/21 >> 2/24 Zosyn 2/21 >>2/28  ASSESSMENT / PLAN: Acute hypoxic respiratory failure Cardiac arrest  VF arrest in setting of cocaine abuse vs MA Bradycardia Chronic systolic HF w/ EF [id number] Adrenal insuff ATN Acute renal failure  H/o hep C  Thrombocytopenia Anoxic encephalopathy  Anoxic brain injury   Discussion  Now actively dying after devastating anoxic brain injury following cardiac arrest. Family decided to transition to comfort on 3/2. He is on morphine gtt. Doubt he will survive the day  Plan Cont morphine gtt.  Supportive care  Erick Colace ACNP-BC [place] Pager # [phone] OR # [phone] if no answer   15-Dec-2016, 9:10 AM

## 2016-12-22 NOTE — Progress Notes (Signed)
Expiration Note:  Patient expired at 70 with RN and Family at bedside.  Auscultated x1 full minute with Ellamae Sia.  No heart sounds or breath sounds noted.  [place] and [employer] notified and aware of expiration.  80 CCs IV Morphine also wasted in sink w/Angela Barlow,RN.

## 2016-12-22 DEATH — deceased

## 2017-07-25 IMAGING — US US ABDOMEN COMPLETE W/ ELASTOGRAPHY
1 series · 13 of 25 positions shown · non-contrast
Comparison: None.

CLINICAL DATA: Chronic hepatitis-C.



[Series 1: us abdomen complete w/ elastography · 0.18mm/px · 13 of 71 slices shown]
[im 1/71]
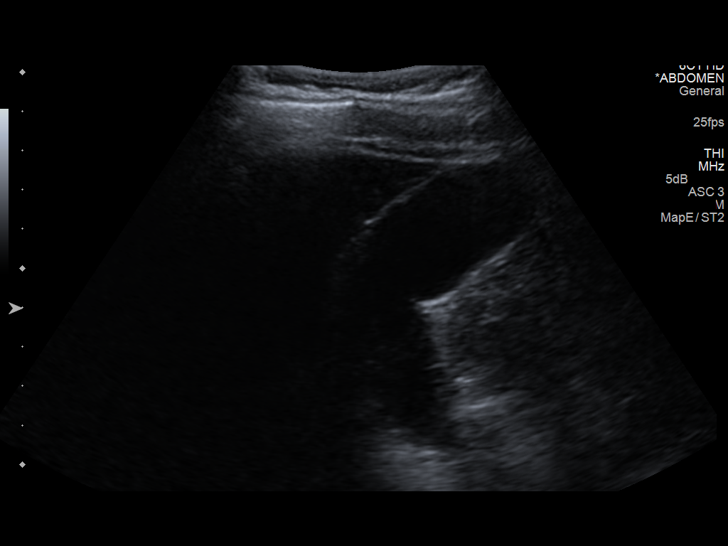
[im 6/71]
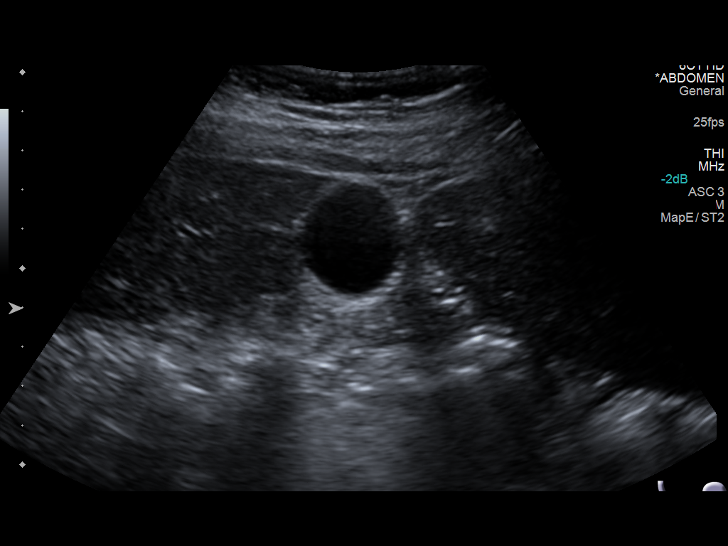
[im 12/71]
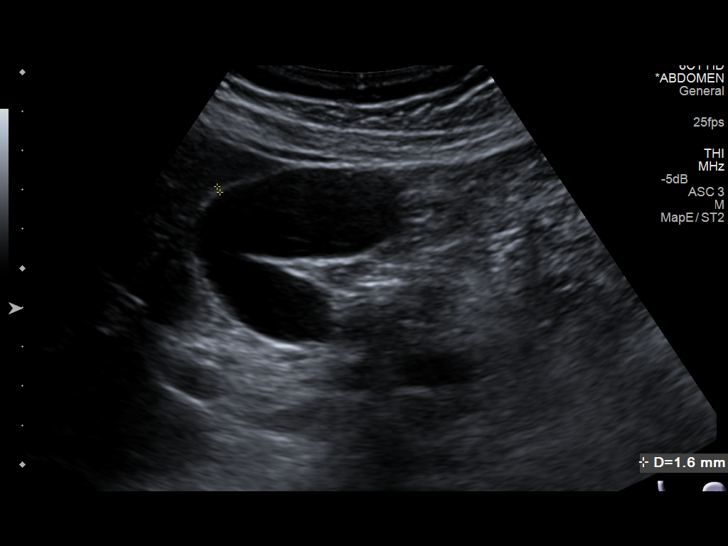
[im 18/71]
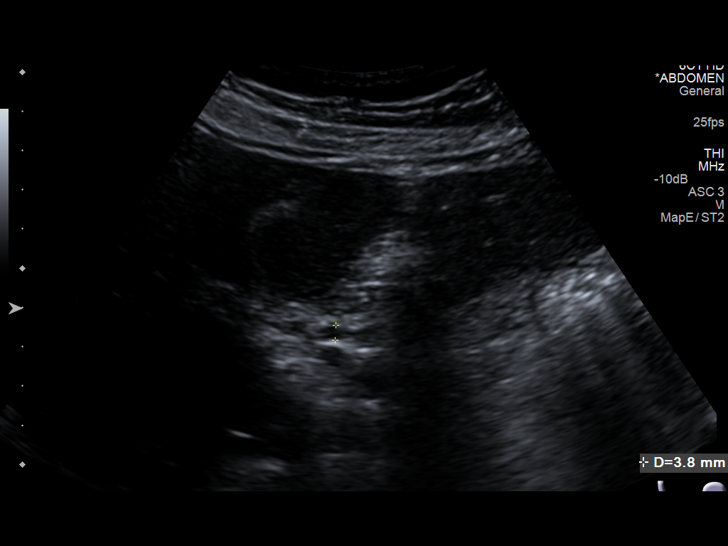
[im 24/71]
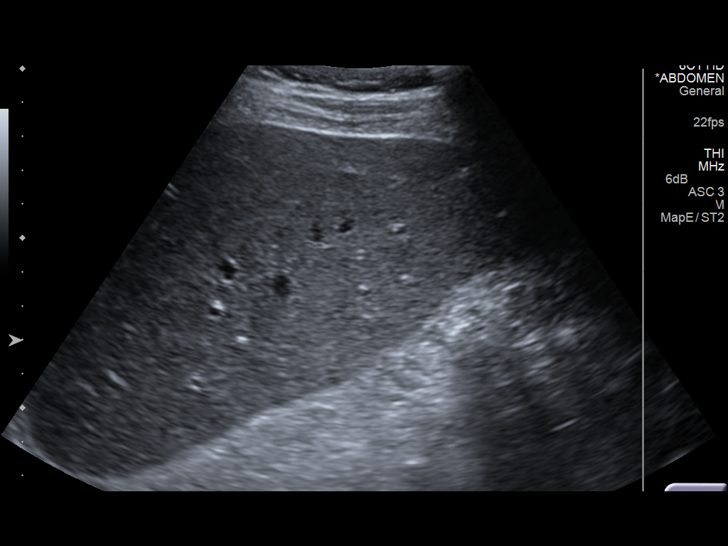
[im 30/71]
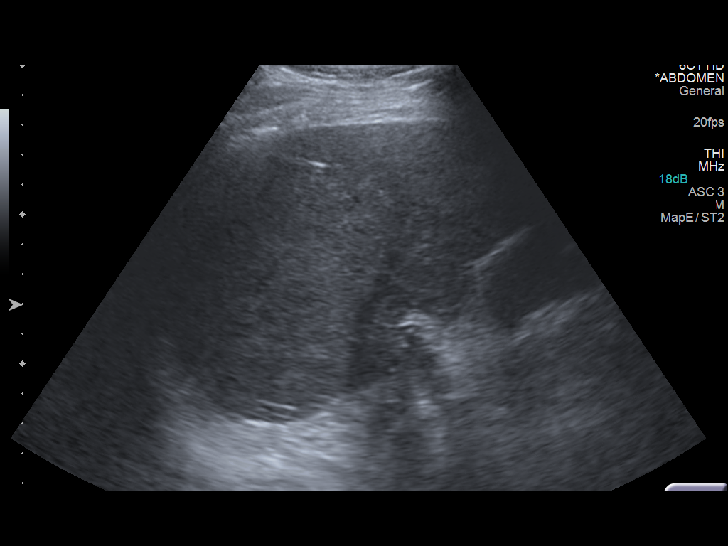
[im 36/71]
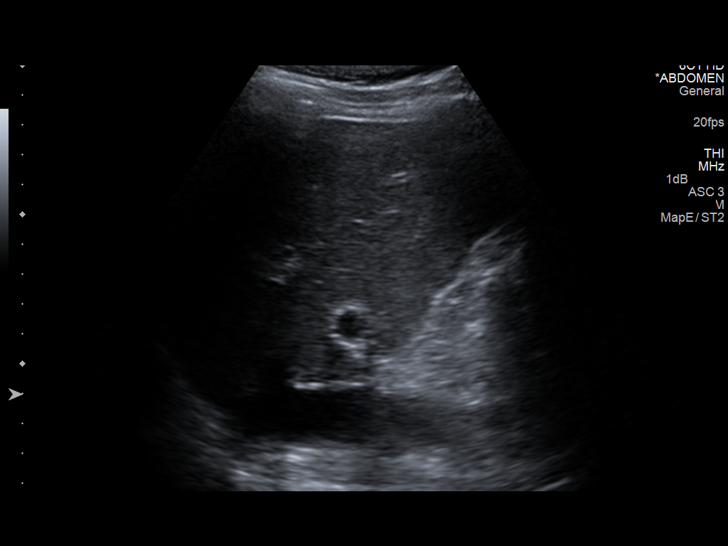
[im 41/71]
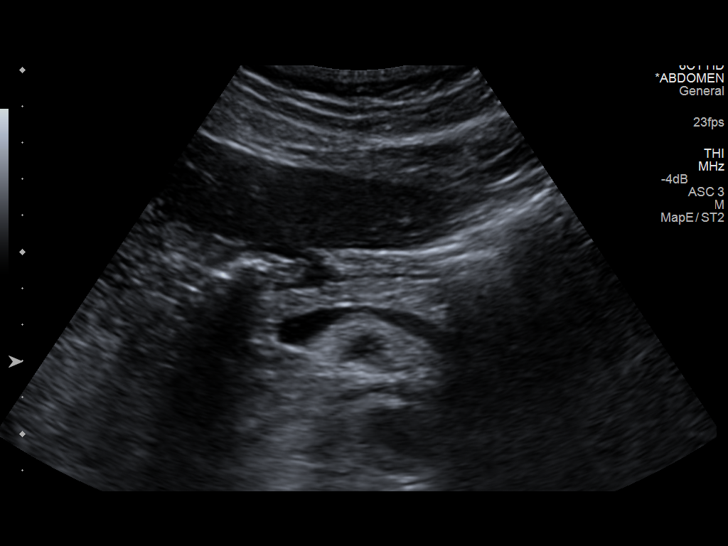
[im 47/71]
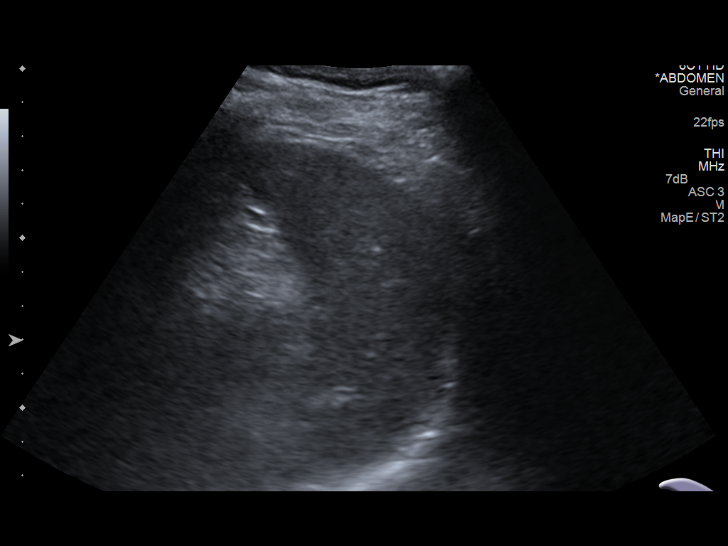
[im 53/71]
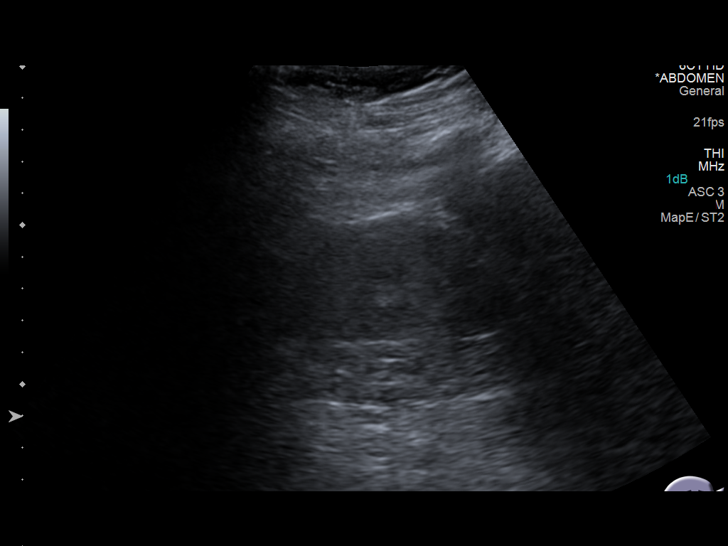
[im 59/71]
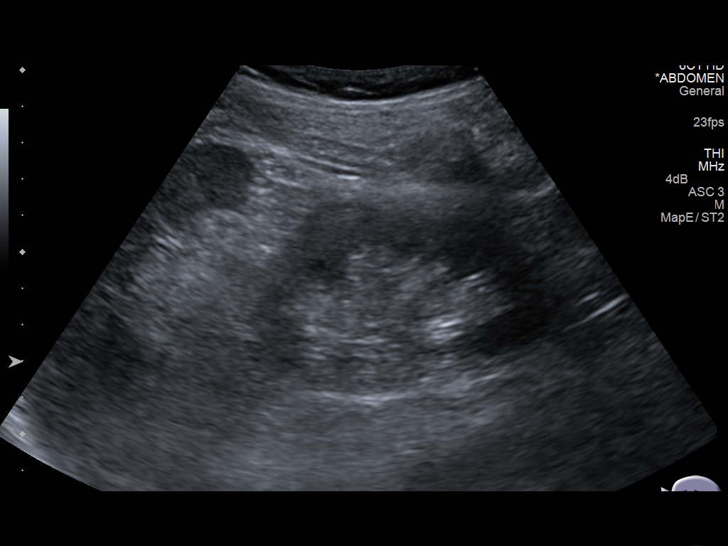
[im 65/71]
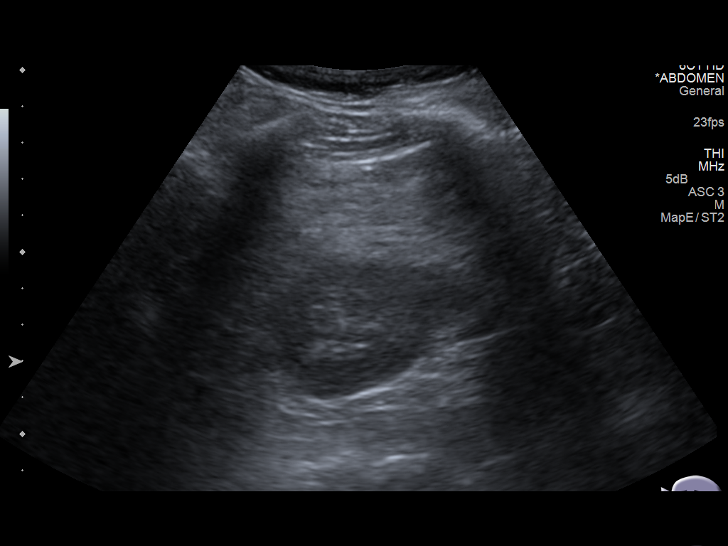
[im 71/71]
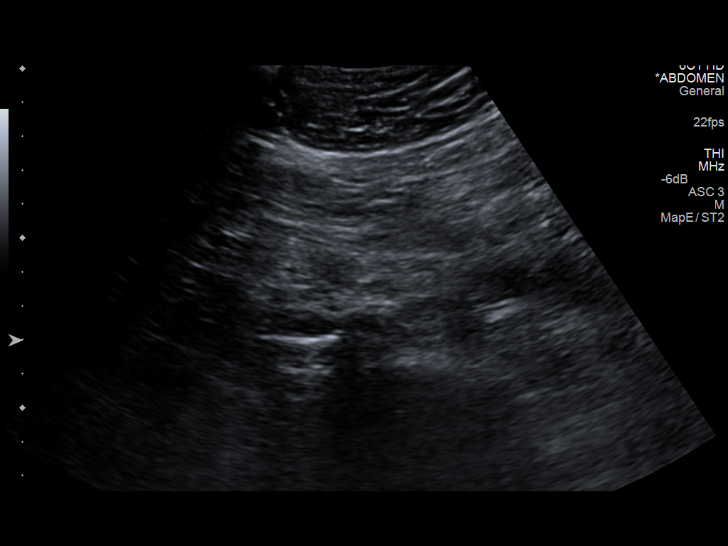

[13 of 25 positions shown; findings below may reference images not displayed]

FINDINGS: ULTRASOUND ABDOMEN

Gallbladder: No gallstones or wall thickening visualized. No
sonographic Murphy sign noted.

Common bile duct: Diameter: 3.8 mm

Liver: No focal lesion identified. Within normal limits in
parenchymal echogenicity.

IVC: No abnormality visualized.

Pancreas: Visualized portion unremarkable.

Spleen: Size and appearance within normal limits.

Right Kidney: Length: 12.8 cm. Echogenicity within normal limits. No
mass or hydronephrosis visualized.

Left Kidney: Length: 10.8 cm. Echogenicity within normal limits. No
mass or hydronephrosis visualized.

Abdominal aorta: Ectatic measuring 2.5 cm.

Other findings: None.

ULTRASOUND HEPATIC ELASTOGRAPHY

Device: Siemens Helix VTQ

Transducer 6 C1 HD

Patient position: Supine

Number of measurements:  10

Hepatic Segment:  8

Median velocity:   1.81  m/sec

IQR:

IQR/Median velocity ratio

Corresponding Metavir fibrosis score:  F 2/F 3

Risk of fibrosis: Moderate

Limitations of exam: None

Pertinent findings noted on other imaging exams:  None

Please note that abnormal shear wave velocities may also be
identified in clinical settings other than with hepatic fibrosis,
such as: acute hepatitis, elevated right heart and central venous
pressures including use of beta blockers, Bu disease
(Yngrid), infiltrative processes such as
mastocytosis/amyloidosis/infiltrative tumor, extrahepatic
cholestasis, in the post-prandial state, and liver transplantation.
Correlation with patient history, laboratory data, and clinical
condition recommended.
IMPRESSION: 1. Ectatic abdominal aorta. Ectatic abdominal aorta at risk for
aneurysm development. Recommend followup by ultrasound in 5 years.
This recommendation follows ACR consensus guidelines: White Paper of
the ACR Incidental Findings Committee II on Vascular Findings. [HOSPITAL] 8256; [DATE].

Median hepatic shear wave velocity is calculated at 1.81 m/sec.

Corresponding Metavir fibrosis score is F 2 and F 3.

Risk of fibrosis is moderate.

Follow-up:  Additional testing appropriate.

## 2019-01-03 IMAGING — DX DG CHEST 1V PORT
1 series · 1 of 1 positions shown · non-contrast
Comparison: 11/19/2016

CLINICAL DATA: Check endotracheal tube position

EXAM:
PORTABLE CHEST 1 VIEW

[chest ap]
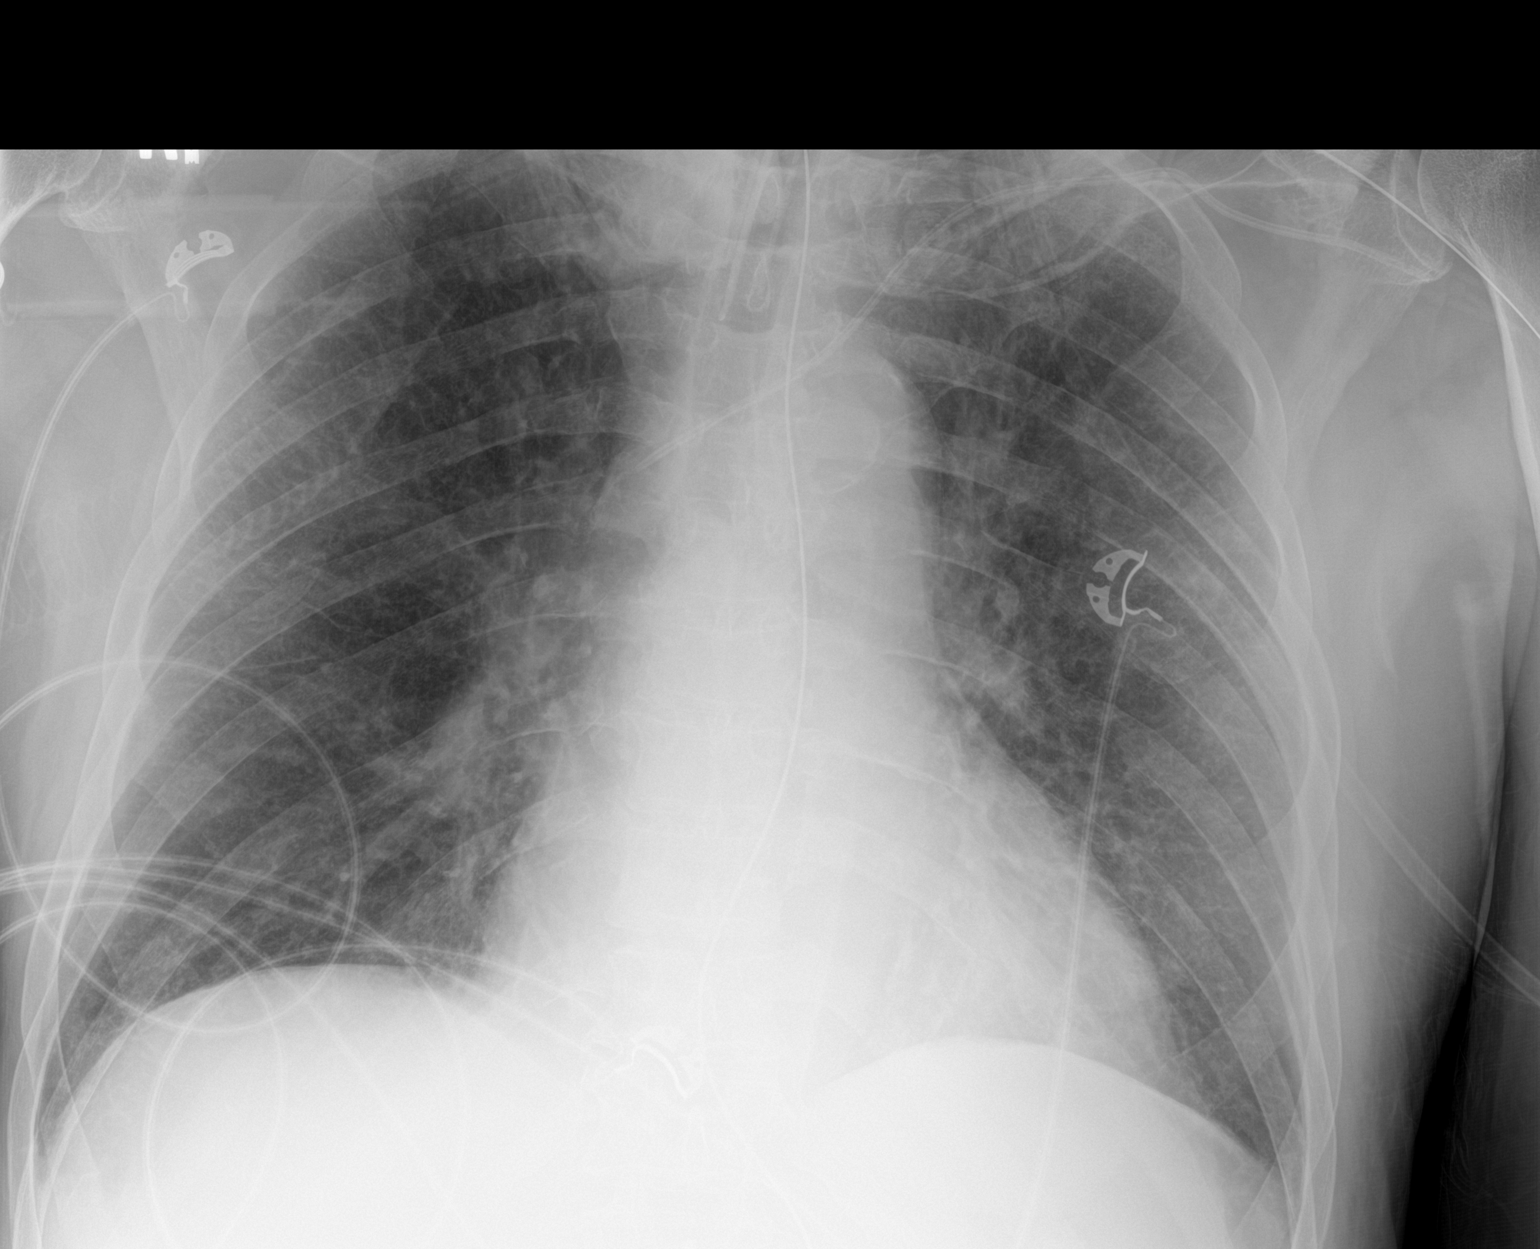

[1 of 1 positions shown; findings below may reference images not displayed]

FINDINGS: Cardiomediastinal silhouette is stable. Endotracheal tube in place
with tip 5.5 cm above the carina. NG tube in place unchanged in
position. No infiltrate or pulmonary edema. Stable left subclavian
central line position. Slight improvement in aeration.
IMPRESSION: Endotracheal tube in place with tip 5.5 cm above the carina. NG tube
in place unchanged in position. No infiltrate or pulmonary edema.
Stable left subclavian central line position.
